# Patient Record
Sex: Male | Born: 1940 | Race: White | Hispanic: No | Marital: Married
Health system: Midwestern US, Academic
[De-identification: ages and names within clinical notes are randomized; demographics above are authoritative.]

---

## 2017-10-22 ENCOUNTER — Encounter: Admit: 2017-10-22 | Discharge: 2017-10-22 | Payer: MEDICARE

## 2017-10-22 DIAGNOSIS — M48061 Spinal stenosis, lumbar region without neurogenic claudication: ICD-10-CM

## 2017-10-22 DIAGNOSIS — K219 Gastro-esophageal reflux disease without esophagitis: ICD-10-CM

## 2017-10-22 DIAGNOSIS — M4307 Spondylolysis, lumbosacral region: ICD-10-CM

## 2017-10-22 DIAGNOSIS — J449 Chronic obstructive pulmonary disease, unspecified: ICD-10-CM

## 2017-10-22 DIAGNOSIS — M1990 Unspecified osteoarthritis, unspecified site: ICD-10-CM

## 2017-10-22 DIAGNOSIS — E669 Obesity, unspecified: ICD-10-CM

## 2017-10-22 DIAGNOSIS — E119 Type 2 diabetes mellitus without complications: ICD-10-CM

## 2017-10-22 DIAGNOSIS — M5106 Intervertebral disc disorders with myelopathy, lumbar region: ICD-10-CM

## 2017-10-22 DIAGNOSIS — I4891 Unspecified atrial fibrillation: Principal | ICD-10-CM

## 2017-10-22 DIAGNOSIS — E785 Hyperlipidemia, unspecified: ICD-10-CM

## 2017-10-22 DIAGNOSIS — Z8551 Personal history of malignant neoplasm of bladder: ICD-10-CM

## 2017-10-22 DIAGNOSIS — I10 Essential (primary) hypertension: ICD-10-CM

## 2017-10-22 DIAGNOSIS — M545 Low back pain: ICD-10-CM

## 2017-10-22 DIAGNOSIS — R972 Elevated prostate specific antigen [PSA]: ICD-10-CM

## 2017-10-22 DIAGNOSIS — C44310 Basal cell carcinoma of skin of unspecified parts of face: ICD-10-CM

## 2017-10-22 DIAGNOSIS — I2510 Atherosclerotic heart disease of native coronary artery without angina pectoris: ICD-10-CM

## 2017-10-22 DIAGNOSIS — R0902 Hypoxemia: ICD-10-CM

## 2017-10-22 DIAGNOSIS — G4733 Obstructive sleep apnea (adult) (pediatric): ICD-10-CM

## 2017-10-22 DIAGNOSIS — I272 Pulmonary hypertension, unspecified: ICD-10-CM

## 2017-10-23 ENCOUNTER — Encounter: Admit: 2017-10-23 | Discharge: 2017-10-23 | Payer: MEDICARE

## 2017-10-23 ENCOUNTER — Ambulatory Visit: Admit: 2017-10-23 | Discharge: 2017-10-23 | Payer: MEDICARE

## 2017-10-23 DIAGNOSIS — I10 Essential (primary) hypertension: ICD-10-CM

## 2017-10-23 DIAGNOSIS — M1990 Unspecified osteoarthritis, unspecified site: ICD-10-CM

## 2017-10-23 DIAGNOSIS — K219 Gastro-esophageal reflux disease without esophagitis: ICD-10-CM

## 2017-10-23 DIAGNOSIS — M5106 Intervertebral disc disorders with myelopathy, lumbar region: ICD-10-CM

## 2017-10-23 DIAGNOSIS — G4733 Obstructive sleep apnea (adult) (pediatric): ICD-10-CM

## 2017-10-23 DIAGNOSIS — C44310 Basal cell carcinoma of skin of unspecified parts of face: ICD-10-CM

## 2017-10-23 DIAGNOSIS — Z9981 Dependence on supplemental oxygen: ICD-10-CM

## 2017-10-23 DIAGNOSIS — E669 Obesity, unspecified: ICD-10-CM

## 2017-10-23 DIAGNOSIS — M4307 Spondylolysis, lumbosacral region: ICD-10-CM

## 2017-10-23 DIAGNOSIS — M545 Low back pain: ICD-10-CM

## 2017-10-23 DIAGNOSIS — I2510 Atherosclerotic heart disease of native coronary artery without angina pectoris: Principal | ICD-10-CM

## 2017-10-23 DIAGNOSIS — E119 Type 2 diabetes mellitus without complications: ICD-10-CM

## 2017-10-23 DIAGNOSIS — I200 Unstable angina: ICD-10-CM

## 2017-10-23 DIAGNOSIS — I4891 Unspecified atrial fibrillation: Principal | ICD-10-CM

## 2017-10-23 DIAGNOSIS — R0600 Dyspnea, unspecified: ICD-10-CM

## 2017-10-23 DIAGNOSIS — Z7901 Long term (current) use of anticoagulants: ICD-10-CM

## 2017-10-23 DIAGNOSIS — R0902 Hypoxemia: ICD-10-CM

## 2017-10-23 DIAGNOSIS — I2583 Coronary atherosclerosis due to lipid rich plaque: ICD-10-CM

## 2017-10-23 DIAGNOSIS — Z8551 Personal history of malignant neoplasm of bladder: ICD-10-CM

## 2017-10-23 DIAGNOSIS — J449 Chronic obstructive pulmonary disease, unspecified: Secondary | ICD-10-CM

## 2017-10-23 DIAGNOSIS — E876 Hypokalemia: Principal | ICD-10-CM

## 2017-10-23 DIAGNOSIS — E785 Hyperlipidemia, unspecified: ICD-10-CM

## 2017-10-23 DIAGNOSIS — M48061 Spinal stenosis, lumbar region without neurogenic claudication: ICD-10-CM

## 2017-10-23 DIAGNOSIS — I272 Pulmonary hypertension, unspecified: ICD-10-CM

## 2017-10-23 DIAGNOSIS — I25110 Atherosclerotic heart disease of native coronary artery with unstable angina pectoris: ICD-10-CM

## 2017-10-23 DIAGNOSIS — E782 Mixed hyperlipidemia: ICD-10-CM

## 2017-10-23 DIAGNOSIS — R972 Elevated prostate specific antigen [PSA]: ICD-10-CM

## 2017-10-23 LAB — CBC
Lab: 15 % — ABNORMAL HIGH (ref >60)
Lab: 15 g/dL (ref 13.5–16.5)
Lab: 225 10*3/uL (ref >60)
Lab: 31 pg (ref 26–34)
Lab: 33 g/dL (ref 32.0–36.0)
Lab: 47 % — ABNORMAL HIGH (ref 40–50)
Lab: 5 M/UL — ABNORMAL HIGH (ref 4.4–5.5)
Lab: 8.4 FL (ref 7–11)
Lab: 9.5 10*3/uL — ABNORMAL LOW (ref 4.5–11.0)
Lab: 93 FL (ref 80–100)

## 2017-10-23 LAB — BASIC METABOLIC PANEL
Lab: 135 MMOL/L — ABNORMAL LOW (ref 137–147)
Lab: 2.7 MMOL/L — CL (ref 3.5–5.1)

## 2017-10-23 MED ORDER — ASPIRIN 81 MG PO CHEW
324 mg | Freq: Once | ORAL | 0 refills | Status: CN
Start: 2017-10-23 — End: ?

## 2017-10-23 MED ORDER — IMS MIXTURE TEMPLATE
60 mg | Freq: Once | ORAL | 0 refills | Status: CN
Start: 2017-10-23 — End: ?

## 2017-10-23 MED ORDER — CLOPIDOGREL 75 MG PO TAB
75 mg | ORAL_TABLET | Freq: Every day | ORAL | 3 refills | 90.00000 days | Status: AC
Start: 2017-10-23 — End: 2018-09-24

## 2017-10-23 MED ORDER — MAGNESIUM OXIDE 400 MG (241.3 MG MAGNESIUM) PO TAB
2000 mg | ORAL_TABLET | Freq: Once | ORAL | 0 refills | Status: AC
Start: 2017-10-23 — End: ?

## 2017-10-23 MED ORDER — PREDNISONE 20 MG PO TAB
ORAL_TABLET | 0 refills | Status: SS
Start: 2017-10-23 — End: 2017-11-07

## 2017-10-24 DIAGNOSIS — Z7901 Long term (current) use of anticoagulants: ICD-10-CM

## 2017-10-24 DIAGNOSIS — I2510 Atherosclerotic heart disease of native coronary artery without angina pectoris: ICD-10-CM

## 2017-10-25 ENCOUNTER — Encounter: Admit: 2017-10-25 | Discharge: 2017-10-25 | Payer: MEDICARE

## 2017-10-31 ENCOUNTER — Encounter: Admit: 2017-10-31 | Discharge: 2017-10-31 | Payer: MEDICARE

## 2017-10-31 DIAGNOSIS — I2510 Atherosclerotic heart disease of native coronary artery without angina pectoris: Principal | ICD-10-CM

## 2017-10-31 MED ORDER — FAMOTIDINE 20 MG PO TAB
20 mg | Freq: Once | ORAL | 0 refills | Status: CN
Start: 2017-10-31 — End: ?

## 2017-10-31 MED ORDER — MAGNESIUM HYDROXIDE 2,400 MG/10 ML PO SUSP
10 mL | ORAL | 0 refills | Status: CN | PRN
Start: 2017-10-31 — End: ?

## 2017-10-31 MED ORDER — DIPHENHYDRAMINE HCL 50 MG PO CAP
50 mg | Freq: Once | ORAL | 0 refills | Status: CN
Start: 2017-10-31 — End: ?

## 2017-10-31 MED ORDER — ACETAMINOPHEN 325 MG PO TAB
650 mg | ORAL | 0 refills | Status: CN | PRN
Start: 2017-10-31 — End: ?

## 2017-10-31 MED ORDER — TEMAZEPAM 15 MG PO CAP
15 mg | Freq: Every evening | ORAL | 0 refills | Status: CN | PRN
Start: 2017-10-31 — End: ?

## 2017-10-31 MED ORDER — ALUMINUM-MAGNESIUM HYDROXIDE 200-200 MG/5 ML PO SUSP
30 mL | ORAL | 0 refills | Status: CN | PRN
Start: 2017-10-31 — End: ?

## 2017-11-02 ENCOUNTER — Encounter: Admit: 2017-11-02 | Discharge: 2017-11-02 | Payer: MEDICARE

## 2017-11-02 DIAGNOSIS — E876 Hypokalemia: Principal | ICD-10-CM

## 2017-11-02 LAB — BASIC METABOLIC PANEL
Lab: 1
Lab: 134 — AB (ref 135–145)
Lab: 21
Lab: 3.9
Lab: 249
Lab: 31
Lab: 8.6
Lab: 97

## 2017-11-07 ENCOUNTER — Encounter: Admit: 2017-11-07 | Discharge: 2017-11-07 | Payer: MEDICARE

## 2017-11-07 ENCOUNTER — Ambulatory Visit: Admit: 2017-11-07 | Discharge: 2017-11-07 | Payer: MEDICARE

## 2017-11-07 ENCOUNTER — Encounter: Admit: 2017-11-07 | Discharge: 2017-11-08 | Payer: MEDICARE

## 2017-11-07 DIAGNOSIS — M545 Low back pain: ICD-10-CM

## 2017-11-07 DIAGNOSIS — E119 Type 2 diabetes mellitus without complications: ICD-10-CM

## 2017-11-07 DIAGNOSIS — R972 Elevated prostate specific antigen [PSA]: ICD-10-CM

## 2017-11-07 DIAGNOSIS — K219 Gastro-esophageal reflux disease without esophagitis: ICD-10-CM

## 2017-11-07 DIAGNOSIS — M1990 Unspecified osteoarthritis, unspecified site: ICD-10-CM

## 2017-11-07 DIAGNOSIS — J449 Chronic obstructive pulmonary disease, unspecified: Secondary | ICD-10-CM

## 2017-11-07 DIAGNOSIS — R0902 Hypoxemia: ICD-10-CM

## 2017-11-07 DIAGNOSIS — I272 Pulmonary hypertension, unspecified: ICD-10-CM

## 2017-11-07 DIAGNOSIS — I4891 Unspecified atrial fibrillation: Principal | ICD-10-CM

## 2017-11-07 DIAGNOSIS — G4733 Obstructive sleep apnea (adult) (pediatric): ICD-10-CM

## 2017-11-07 DIAGNOSIS — I2510 Atherosclerotic heart disease of native coronary artery without angina pectoris: Secondary | ICD-10-CM

## 2017-11-07 DIAGNOSIS — I200 Unstable angina: ICD-10-CM

## 2017-11-07 DIAGNOSIS — M5106 Intervertebral disc disorders with myelopathy, lumbar region: ICD-10-CM

## 2017-11-07 DIAGNOSIS — Z9981 Dependence on supplemental oxygen: ICD-10-CM

## 2017-11-07 DIAGNOSIS — Z8551 Personal history of malignant neoplasm of bladder: ICD-10-CM

## 2017-11-07 DIAGNOSIS — Z7901 Long term (current) use of anticoagulants: ICD-10-CM

## 2017-11-07 DIAGNOSIS — M4307 Spondylolysis, lumbosacral region: ICD-10-CM

## 2017-11-07 DIAGNOSIS — C44310 Basal cell carcinoma of skin of unspecified parts of face: ICD-10-CM

## 2017-11-07 DIAGNOSIS — I10 Essential (primary) hypertension: ICD-10-CM

## 2017-11-07 DIAGNOSIS — E669 Obesity, unspecified: ICD-10-CM

## 2017-11-07 DIAGNOSIS — M48061 Spinal stenosis, lumbar region without neurogenic claudication: ICD-10-CM

## 2017-11-07 DIAGNOSIS — E785 Hyperlipidemia, unspecified: ICD-10-CM

## 2017-11-07 LAB — LIPID PROFILE
Lab: 100 mg/dL
Lab: 13 mg/dL
Lab: 157 mg/dL (ref <200)
Lab: 57 mg/dL (ref >40)
Lab: 66 mg/dL (ref <150)
Lab: 88 mg/dL (ref <100)

## 2017-11-07 LAB — BASIC METABOLIC PANEL
Lab: 0.9 mg/dL (ref 0.4–1.24)
Lab: 136 MMOL/L — ABNORMAL LOW (ref >=60)
Lab: 20 mg/dL — ABNORMAL LOW (ref 7–25)
Lab: 227 mg/dL — ABNORMAL HIGH (ref 70–100)
Lab: 3.3 MMOL/L — ABNORMAL LOW (ref >=60)
Lab: 32 MMOL/L — ABNORMAL HIGH (ref 21–30)

## 2017-11-07 LAB — POC ACTIVATED CLOTTING TIME
Lab: 305 s
Lab: 286 s (ref 21–30)
Lab: 197 s
Lab: 207 s
Lab: 176 s

## 2017-11-07 LAB — CBC
Lab: 15 % — ABNORMAL HIGH (ref 11–15)
Lab: 15 g/dL (ref 13.5–16.5)
Lab: 211 10*3/uL (ref 150–400)
Lab: 31 pg (ref 26–34)
Lab: 33 g/dL (ref 32.0–36.0)
Lab: 46 % (ref 40–50)
Lab: 8 FL — ABNORMAL HIGH (ref 7–11)
Lab: 8.6 10*3/uL (ref 4.5–11.0)
Lab: 93 FL (ref 80–100)

## 2017-11-07 LAB — POC GLUCOSE
Lab: 219 mg/dL — ABNORMAL HIGH (ref 70–100)
Lab: 204 mg/dL — ABNORMAL HIGH (ref 70–100)

## 2017-11-07 LAB — TROPONIN-I

## 2017-11-07 MED ORDER — INSULIN ASPART 100 UNIT/ML SC FLEXPEN
0-6 [IU] | Freq: Before meals | SUBCUTANEOUS | 0 refills | Status: DC
Start: 2017-11-07 — End: 2017-11-08
  Administered 2017-11-07: 23:00:00 1 [IU] via SUBCUTANEOUS

## 2017-11-07 MED ORDER — PREDNISONE 20 MG PO TAB
60 mg | Freq: Once | ORAL | 0 refills | Status: AC
Start: 2017-11-07 — End: ?

## 2017-11-07 MED ORDER — DIPHENHYDRAMINE HCL 50 MG PO CAP
50 mg | Freq: Once | ORAL | 0 refills | Status: CP
Start: 2017-11-07 — End: ?
  Administered 2017-11-07: 14:00:00 50 mg via ORAL

## 2017-11-07 MED ORDER — DIPHENHYDRAMINE HCL 25 MG PO CAP
25 mg | ORAL | 0 refills | Status: DC | PRN
Start: 2017-11-07 — End: 2017-11-08

## 2017-11-07 MED ORDER — BUMETANIDE 2 MG PO TAB
2 mg | Freq: Every day | ORAL | 0 refills | Status: DC
Start: 2017-11-07 — End: 2017-11-08

## 2017-11-07 MED ORDER — ASPIRIN 81 MG PO CHEW
324 mg | Freq: Once | ORAL | 0 refills | Status: DC
Start: 2017-11-07 — End: 2017-11-07

## 2017-11-07 MED ORDER — MONTELUKAST 10 MG PO TAB
10 mg | Freq: Every evening | ORAL | 0 refills | Status: DC
Start: 2017-11-07 — End: 2017-11-08
  Administered 2017-11-08: 03:00:00 10 mg via ORAL

## 2017-11-07 MED ORDER — METOPROLOL TARTRATE 25 MG PO TAB
25 mg | Freq: Two times a day (BID) | ORAL | 0 refills | Status: DC
Start: 2017-11-07 — End: 2017-11-08
  Administered 2017-11-08 (×2): 25 mg via ORAL

## 2017-11-07 MED ORDER — POTASSIUM CHLORIDE 20 MEQ PO TBTQ
40-60 meq | ORAL | 0 refills | Status: DC | PRN
Start: 2017-11-07 — End: 2017-11-08
  Administered 2017-11-07 – 2017-11-08 (×2): 40 meq via ORAL

## 2017-11-07 MED ORDER — ATORVASTATIN 20 MG PO TAB
20 mg | Freq: Every evening | ORAL | 0 refills | Status: DC
Start: 2017-11-07 — End: 2017-11-07

## 2017-11-07 MED ORDER — CLOPIDOGREL 75 MG PO TAB
75 mg | Freq: Every day | ORAL | 0 refills | Status: DC
Start: 2017-11-07 — End: 2017-11-07

## 2017-11-07 MED ORDER — HYDROCODONE-ACETAMINOPHEN 5-325 MG PO TAB
1 | ORAL | 0 refills | Status: DC | PRN
Start: 2017-11-07 — End: 2017-11-08

## 2017-11-07 MED ORDER — PIOGLITAZONE 15 MG PO TAB
15 mg | Freq: Every day | ORAL | 0 refills | Status: DC
Start: 2017-11-07 — End: 2017-11-08
  Administered 2017-11-07: 23:00:00 15 mg via ORAL

## 2017-11-07 MED ORDER — CLOPIDOGREL 75 MG PO TAB
75 mg | Freq: Once | ORAL | 0 refills | Status: CP
Start: 2017-11-07 — End: ?
  Administered 2017-11-07: 14:00:00 75 mg via ORAL

## 2017-11-07 MED ORDER — FAMOTIDINE 20 MG PO TAB
20 mg | Freq: Once | ORAL | 0 refills | Status: CP
Start: 2017-11-07 — End: ?
  Administered 2017-11-07: 14:00:00 20 mg via ORAL

## 2017-11-07 MED ORDER — FAMOTIDINE 20 MG PO TAB
20 mg | Freq: Two times a day (BID) | ORAL | 0 refills | Status: DC
Start: 2017-11-07 — End: 2017-11-08
  Administered 2017-11-08: 03:00:00 20 mg via ORAL

## 2017-11-07 MED ORDER — MAGNESIUM HYDROXIDE 2,400 MG/10 ML PO SUSP
10 mL | ORAL | 0 refills | Status: DC | PRN
Start: 2017-11-07 — End: 2017-11-08

## 2017-11-07 MED ORDER — ALPRAZOLAM 0.25 MG PO TAB
.25 mg | Freq: Every day | ORAL | 0 refills | Status: DC | PRN
Start: 2017-11-07 — End: 2017-11-08

## 2017-11-07 MED ORDER — POTASSIUM CHLORIDE 20 MEQ PO TBTQ
20 meq | Freq: Two times a day (BID) | ORAL | 0 refills | Status: DC
Start: 2017-11-07 — End: 2017-11-08

## 2017-11-07 MED ORDER — CLOPIDOGREL 75 MG PO TAB
75 mg | Freq: Every day | ORAL | 0 refills | Status: DC
Start: 2017-11-07 — End: 2017-11-08

## 2017-11-07 MED ORDER — ONDANSETRON HCL (PF) 4 MG/2 ML IJ SOLN
4 mg | INTRAVENOUS | 0 refills | Status: DC | PRN
Start: 2017-11-07 — End: 2017-11-08

## 2017-11-07 MED ORDER — INSULIN NPH AND REGULAR HUMAN 100 UNIT/ML (70-30) SC SUSP
60 [IU] | Freq: Once | SUBCUTANEOUS | 0 refills | Status: CP
Start: 2017-11-07 — End: ?
  Administered 2017-11-08: 03:00:00 60 [IU] via SUBCUTANEOUS

## 2017-11-07 MED ORDER — ALBUTEROL SULFATE 90 MCG/ACTUATION IN HFAA
2 | RESPIRATORY_TRACT | 0 refills | Status: DC | PRN
Start: 2017-11-07 — End: 2017-11-08

## 2017-11-07 MED ORDER — ATORVASTATIN 40 MG PO TAB
40 mg | Freq: Every evening | ORAL | 0 refills | Status: DC
Start: 2017-11-07 — End: 2017-11-08
  Administered 2017-11-08: 03:00:00 40 mg via ORAL

## 2017-11-07 MED ORDER — INSULIN NPH AND REGULAR HUMAN 100 UNIT/ML (70-30) SC SUSP
80 [IU] | Freq: Every morning | SUBCUTANEOUS | 0 refills | Status: DC
Start: 2017-11-07 — End: 2017-11-08
  Administered 2017-11-08: 13:00:00 80 [IU] via SUBCUTANEOUS

## 2017-11-07 MED ORDER — APIXABAN 5 MG PO TAB
5 mg | Freq: Two times a day (BID) | ORAL | 0 refills | Status: DC
Start: 2017-11-07 — End: 2017-11-08
  Administered 2017-11-08: 13:00:00 5 mg via ORAL

## 2017-11-07 MED ORDER — ATORVASTATIN 40 MG PO TAB
20 mg | ORAL_TABLET | Freq: Every evening | ORAL | 3 refills | Status: CN
Start: 2017-11-07 — End: ?

## 2017-11-07 MED ORDER — ALUMINUM-MAGNESIUM HYDROXIDE 200-200 MG/5 ML PO SUSP
30 mL | ORAL | 0 refills | Status: DC | PRN
Start: 2017-11-07 — End: 2017-11-08

## 2017-11-07 MED ORDER — DIPHENHYDRAMINE HCL 50 MG/ML IJ SOLN
25 mg | INTRAVENOUS | 0 refills | Status: DC | PRN
Start: 2017-11-07 — End: 2017-11-08

## 2017-11-07 MED ORDER — DILTIAZEM HCL 240 MG PO CP24
240 mg | Freq: Every day | ORAL | 0 refills | Status: DC
Start: 2017-11-07 — End: 2017-11-08
  Administered 2017-11-07 – 2017-11-08 (×2): 240 mg via ORAL

## 2017-11-07 MED ORDER — TEMAZEPAM 15 MG PO CAP
15 mg | Freq: Every evening | ORAL | 0 refills | Status: DC | PRN
Start: 2017-11-07 — End: 2017-11-08

## 2017-11-07 MED ORDER — POTASSIUM CHLORIDE 20 MEQ/15 ML PO LIQD
40-60 meq | NASOGASTRIC | 0 refills | Status: DC | PRN
Start: 2017-11-07 — End: 2017-11-08

## 2017-11-07 MED ORDER — PANTOPRAZOLE 40 MG PO TBEC
40 mg | Freq: Two times a day (BID) | ORAL | 0 refills | Status: DC
Start: 2017-11-07 — End: 2017-11-08

## 2017-11-07 MED ORDER — TIOTROPIUM BROMIDE 18 MCG IN CPDV
1 | Freq: Every day | RESPIRATORY_TRACT | 0 refills | Status: DC
Start: 2017-11-07 — End: 2017-11-08
  Administered 2017-11-08: 11:00:00 1 via RESPIRATORY_TRACT

## 2017-11-07 MED ORDER — ACETAMINOPHEN 325 MG PO TAB
650 mg | ORAL | 0 refills | Status: DC | PRN
Start: 2017-11-07 — End: 2017-11-08

## 2017-11-07 MED ORDER — METFORMIN 500 MG PO TAB
500 mg | ORAL_TABLET | Freq: Two times a day (BID) | ORAL | 3 refills | Status: CN
Start: 2017-11-07 — End: ?

## 2017-11-07 MED ORDER — CLOPIDOGREL 75 MG PO TAB
75 mg | ORAL_TABLET | Freq: Every day | ORAL | 3 refills | Status: CN
Start: 2017-11-07 — End: ?

## 2017-11-08 DIAGNOSIS — Z794 Long term (current) use of insulin: ICD-10-CM

## 2017-11-08 DIAGNOSIS — Z006 Encounter for examination for normal comparison and control in clinical research program: ICD-10-CM

## 2017-11-08 DIAGNOSIS — I25110 Atherosclerotic heart disease of native coronary artery with unstable angina pectoris: Principal | ICD-10-CM

## 2017-11-08 DIAGNOSIS — R0609 Other forms of dyspnea: ICD-10-CM

## 2017-11-08 DIAGNOSIS — E118 Type 2 diabetes mellitus with unspecified complications: ICD-10-CM

## 2017-11-08 DIAGNOSIS — I2582 Chronic total occlusion of coronary artery: ICD-10-CM

## 2017-11-08 LAB — TROPONIN-I

## 2017-11-08 LAB — POC ACTIVATED CLOTTING TIME
Lab: 279 s (ref 11–15)
Lab: 283 s
Lab: 286 s
Lab: 363 s
Lab: 253 s — ABNORMAL LOW (ref 7–40)

## 2017-11-08 LAB — POC GLUCOSE
Lab: 220 mg/dL — ABNORMAL HIGH (ref 70–100)
Lab: 153 mg/dL — ABNORMAL HIGH (ref 70–100)

## 2017-11-08 LAB — BASIC METABOLIC PANEL: Lab: 135 MMOL/L — ABNORMAL LOW (ref 137–147)

## 2017-11-08 LAB — CBC
Lab: 4.8 M/UL — ABNORMAL LOW (ref 4.4–5.5)
Lab: 9.9 K/UL — ABNORMAL HIGH (ref 4.5–11.0)

## 2017-11-08 MED ORDER — METFORMIN 500 MG PO TAB
500 mg | ORAL_TABLET | Freq: Two times a day (BID) | ORAL | 3 refills | Status: AC
Start: 2017-11-08 — End: ?

## 2017-11-08 MED ORDER — ATORVASTATIN 40 MG PO TAB
40 mg | ORAL_TABLET | Freq: Every evening | ORAL | 3 refills | Status: AC
Start: 2017-11-08 — End: 2018-01-11

## 2017-11-27 ENCOUNTER — Encounter: Admit: 2017-11-27 | Discharge: 2017-11-27 | Payer: MEDICARE

## 2017-11-29 ENCOUNTER — Encounter: Admit: 2017-11-29 | Discharge: 2017-11-29 | Payer: MEDICARE

## 2017-11-29 DIAGNOSIS — E785 Hyperlipidemia, unspecified: ICD-10-CM

## 2017-11-29 DIAGNOSIS — M48061 Spinal stenosis, lumbar region without neurogenic claudication: ICD-10-CM

## 2017-11-29 DIAGNOSIS — K219 Gastro-esophageal reflux disease without esophagitis: ICD-10-CM

## 2017-11-29 DIAGNOSIS — R0902 Hypoxemia: ICD-10-CM

## 2017-11-29 DIAGNOSIS — M545 Low back pain: ICD-10-CM

## 2017-11-29 DIAGNOSIS — M1990 Unspecified osteoarthritis, unspecified site: ICD-10-CM

## 2017-11-29 DIAGNOSIS — Z7901 Long term (current) use of anticoagulants: ICD-10-CM

## 2017-11-29 DIAGNOSIS — I2510 Atherosclerotic heart disease of native coronary artery without angina pectoris: Secondary | ICD-10-CM

## 2017-11-29 DIAGNOSIS — R972 Elevated prostate specific antigen [PSA]: ICD-10-CM

## 2017-11-29 DIAGNOSIS — C44310 Basal cell carcinoma of skin of unspecified parts of face: ICD-10-CM

## 2017-11-29 DIAGNOSIS — E119 Type 2 diabetes mellitus without complications: ICD-10-CM

## 2017-11-29 DIAGNOSIS — M4307 Spondylolysis, lumbosacral region: ICD-10-CM

## 2017-11-29 DIAGNOSIS — I272 Pulmonary hypertension, unspecified: ICD-10-CM

## 2017-11-29 DIAGNOSIS — E669 Obesity, unspecified: ICD-10-CM

## 2017-11-29 DIAGNOSIS — J449 Chronic obstructive pulmonary disease, unspecified: Secondary | ICD-10-CM

## 2017-11-29 DIAGNOSIS — I200 Unstable angina: ICD-10-CM

## 2017-11-29 DIAGNOSIS — G4733 Obstructive sleep apnea (adult) (pediatric): ICD-10-CM

## 2017-11-29 DIAGNOSIS — M5106 Intervertebral disc disorders with myelopathy, lumbar region: ICD-10-CM

## 2017-11-29 DIAGNOSIS — I10 Essential (primary) hypertension: ICD-10-CM

## 2017-11-29 DIAGNOSIS — Z9981 Dependence on supplemental oxygen: ICD-10-CM

## 2017-11-29 DIAGNOSIS — Z8551 Personal history of malignant neoplasm of bladder: ICD-10-CM

## 2017-11-29 DIAGNOSIS — I4891 Unspecified atrial fibrillation: Principal | ICD-10-CM

## 2017-11-29 MED ORDER — ISOSORBIDE MONONITRATE 30 MG PO TB24
30 mg | ORAL_TABLET | Freq: Every morning | ORAL | 3 refills | 30.00000 days | Status: AC
Start: 2017-11-29 — End: 2018-03-12

## 2017-11-30 ENCOUNTER — Ambulatory Visit: Admit: 2017-11-29 | Discharge: 2017-11-30 | Payer: MEDICARE

## 2017-11-30 ENCOUNTER — Encounter: Admit: 2017-11-30 | Discharge: 2017-11-30 | Payer: MEDICARE

## 2017-11-30 DIAGNOSIS — I2510 Atherosclerotic heart disease of native coronary artery without angina pectoris: Principal | ICD-10-CM

## 2017-11-30 DIAGNOSIS — I482 Chronic atrial fibrillation: ICD-10-CM

## 2017-11-30 DIAGNOSIS — I2583 Coronary atherosclerosis due to lipid rich plaque: ICD-10-CM

## 2017-11-30 DIAGNOSIS — I10 Essential (primary) hypertension: ICD-10-CM

## 2017-11-30 DIAGNOSIS — E782 Mixed hyperlipidemia: ICD-10-CM

## 2017-12-04 ENCOUNTER — Encounter: Admit: 2017-12-04 | Discharge: 2017-12-04 | Payer: MEDICARE

## 2017-12-05 ENCOUNTER — Encounter: Admit: 2017-12-05 | Discharge: 2017-12-05 | Payer: MEDICARE

## 2017-12-20 LAB — LIPID PROFILE
Lab: 128
Lab: 138 — ABNORMAL HIGH (ref 21–30)
Lab: 2.2 — ABNORMAL HIGH (ref 0.2–1.2)
Lab: 49 — ABNORMAL LOW (ref 60–99)
Lab: 63 — ABNORMAL HIGH (ref 40–60)
Lab: 75 — ABNORMAL LOW (ref 90–129)

## 2017-12-20 LAB — COMPREHENSIVE METABOLIC PANEL
Lab: 136
Lab: 18
Lab: 31
Lab: 57

## 2017-12-20 LAB — THYROID STIMULATING HORMONE-TSH: Lab: 1.8 — ABNORMAL LOW (ref 97–107)

## 2017-12-20 LAB — HEMOGLOBIN A1C: Lab: 7.4 — ABNORMAL HIGH (ref <5.6)

## 2018-01-11 ENCOUNTER — Ambulatory Visit: Admit: 2018-01-11 | Discharge: 2018-01-12 | Payer: MEDICARE

## 2018-01-11 ENCOUNTER — Encounter: Admit: 2018-01-11 | Discharge: 2018-01-11 | Payer: MEDICARE

## 2018-01-11 DIAGNOSIS — I10 Essential (primary) hypertension: ICD-10-CM

## 2018-01-11 DIAGNOSIS — M5106 Intervertebral disc disorders with myelopathy, lumbar region: ICD-10-CM

## 2018-01-11 DIAGNOSIS — I2510 Atherosclerotic heart disease of native coronary artery without angina pectoris: Secondary | ICD-10-CM

## 2018-01-11 DIAGNOSIS — R0902 Hypoxemia: ICD-10-CM

## 2018-01-11 DIAGNOSIS — Z8551 Personal history of malignant neoplasm of bladder: ICD-10-CM

## 2018-01-11 DIAGNOSIS — M545 Low back pain: ICD-10-CM

## 2018-01-11 DIAGNOSIS — I272 Pulmonary hypertension, unspecified: ICD-10-CM

## 2018-01-11 DIAGNOSIS — E1159 Type 2 diabetes mellitus with other circulatory complications: ICD-10-CM

## 2018-01-11 DIAGNOSIS — I482 Chronic atrial fibrillation, unspecified: ICD-10-CM

## 2018-01-11 DIAGNOSIS — K219 Gastro-esophageal reflux disease without esophagitis: ICD-10-CM

## 2018-01-11 DIAGNOSIS — E119 Type 2 diabetes mellitus without complications: ICD-10-CM

## 2018-01-11 DIAGNOSIS — Z9981 Dependence on supplemental oxygen: ICD-10-CM

## 2018-01-11 DIAGNOSIS — E785 Hyperlipidemia, unspecified: ICD-10-CM

## 2018-01-11 DIAGNOSIS — E669 Obesity, unspecified: ICD-10-CM

## 2018-01-11 DIAGNOSIS — I4891 Unspecified atrial fibrillation: Principal | ICD-10-CM

## 2018-01-11 DIAGNOSIS — I519 Heart disease, unspecified: ICD-10-CM

## 2018-01-11 DIAGNOSIS — E782 Mixed hyperlipidemia: ICD-10-CM

## 2018-01-11 DIAGNOSIS — R972 Elevated prostate specific antigen [PSA]: ICD-10-CM

## 2018-01-11 DIAGNOSIS — G4733 Obstructive sleep apnea (adult) (pediatric): ICD-10-CM

## 2018-01-11 DIAGNOSIS — Z7901 Long term (current) use of anticoagulants: ICD-10-CM

## 2018-01-11 DIAGNOSIS — J449 Chronic obstructive pulmonary disease, unspecified: Secondary | ICD-10-CM

## 2018-01-11 DIAGNOSIS — I208 Other forms of angina pectoris: ICD-10-CM

## 2018-01-11 DIAGNOSIS — I25118 Atherosclerotic heart disease of native coronary artery with other forms of angina pectoris: Principal | ICD-10-CM

## 2018-01-11 DIAGNOSIS — M1990 Unspecified osteoarthritis, unspecified site: ICD-10-CM

## 2018-01-11 DIAGNOSIS — M48061 Spinal stenosis, lumbar region without neurogenic claudication: ICD-10-CM

## 2018-01-11 DIAGNOSIS — C44310 Basal cell carcinoma of skin of unspecified parts of face: ICD-10-CM

## 2018-01-11 DIAGNOSIS — I200 Unstable angina: ICD-10-CM

## 2018-01-11 DIAGNOSIS — M4307 Spondylolysis, lumbosacral region: ICD-10-CM

## 2018-01-11 MED ORDER — METOPROLOL SUCCINATE 50 MG PO TB24
50 mg | ORAL_TABLET | Freq: Every day | ORAL | 3 refills | 90.00000 days | Status: AC
Start: 2018-01-11 — End: 2018-02-05

## 2018-01-11 MED ORDER — ATORVASTATIN 80 MG PO TAB
80 mg | ORAL_TABLET | Freq: Every evening | ORAL | 3 refills | Status: AC
Start: 2018-01-11 — End: 2018-01-11

## 2018-01-11 MED ORDER — SPIRONOLACTONE 25 MG PO TAB
25 mg | ORAL_TABLET | Freq: Every day | ORAL | 3 refills | 90.00000 days | Status: AC
Start: 2018-01-11 — End: 2019-01-01

## 2018-01-11 MED ORDER — ATORVASTATIN 40 MG PO TAB
40 mg | ORAL_TABLET | Freq: Every evening | ORAL | 3 refills | Status: AC
Start: 2018-01-11 — End: 2018-01-30

## 2018-01-11 MED ORDER — BUMETANIDE 2 MG PO TAB
2 mg | Freq: Two times a day (BID) | ORAL | 0 refills | Status: AC
Start: 2018-01-11 — End: ?

## 2018-01-11 MED ORDER — METOLAZONE 2.5 MG PO TAB
2.5 mg | ORAL | 0 refills | 84.00000 days | Status: AC | PRN
Start: 2018-01-11 — End: 2018-01-30

## 2018-01-14 ENCOUNTER — Encounter: Admit: 2018-01-14 | Discharge: 2018-01-14 | Payer: MEDICARE

## 2018-01-21 LAB — BASIC METABOLIC PANEL
Lab: 140
Lab: 2.6 — AB (ref 1.60–2.60)
Lab: 4.2
Lab: 76
Lab: 88
Lab: 9.5

## 2018-01-22 ENCOUNTER — Encounter: Admit: 2018-01-22 | Discharge: 2018-01-22 | Payer: MEDICARE

## 2018-01-30 ENCOUNTER — Encounter: Admit: 2018-01-30 | Discharge: 2018-01-30 | Payer: MEDICARE

## 2018-01-30 ENCOUNTER — Ambulatory Visit: Admit: 2018-01-30 | Discharge: 2018-01-31 | Payer: MEDICARE

## 2018-01-30 DIAGNOSIS — I4891 Unspecified atrial fibrillation: Principal | ICD-10-CM

## 2018-01-30 DIAGNOSIS — M4307 Spondylolysis, lumbosacral region: ICD-10-CM

## 2018-01-30 DIAGNOSIS — J449 Chronic obstructive pulmonary disease, unspecified: Secondary | ICD-10-CM

## 2018-01-30 DIAGNOSIS — Z9981 Dependence on supplemental oxygen: ICD-10-CM

## 2018-01-30 DIAGNOSIS — I519 Heart disease, unspecified: ICD-10-CM

## 2018-01-30 DIAGNOSIS — C44310 Basal cell carcinoma of skin of unspecified parts of face: ICD-10-CM

## 2018-01-30 DIAGNOSIS — I200 Unstable angina: ICD-10-CM

## 2018-01-30 DIAGNOSIS — I208 Other forms of angina pectoris: ICD-10-CM

## 2018-01-30 DIAGNOSIS — I10 Essential (primary) hypertension: ICD-10-CM

## 2018-01-30 DIAGNOSIS — M545 Low back pain: ICD-10-CM

## 2018-01-30 DIAGNOSIS — M48061 Spinal stenosis, lumbar region without neurogenic claudication: ICD-10-CM

## 2018-01-30 DIAGNOSIS — R972 Elevated prostate specific antigen [PSA]: ICD-10-CM

## 2018-01-30 DIAGNOSIS — I2510 Atherosclerotic heart disease of native coronary artery without angina pectoris: Secondary | ICD-10-CM

## 2018-01-30 DIAGNOSIS — E785 Hyperlipidemia, unspecified: ICD-10-CM

## 2018-01-30 DIAGNOSIS — E119 Type 2 diabetes mellitus without complications: ICD-10-CM

## 2018-01-30 DIAGNOSIS — Z8551 Personal history of malignant neoplasm of bladder: ICD-10-CM

## 2018-01-30 DIAGNOSIS — G4733 Obstructive sleep apnea (adult) (pediatric): ICD-10-CM

## 2018-01-30 DIAGNOSIS — I25118 Atherosclerotic heart disease of native coronary artery with other forms of angina pectoris: Principal | ICD-10-CM

## 2018-01-30 DIAGNOSIS — E1159 Type 2 diabetes mellitus with other circulatory complications: ICD-10-CM

## 2018-01-30 DIAGNOSIS — R0902 Hypoxemia: ICD-10-CM

## 2018-01-30 DIAGNOSIS — M1990 Unspecified osteoarthritis, unspecified site: ICD-10-CM

## 2018-01-30 DIAGNOSIS — I272 Pulmonary hypertension, unspecified: ICD-10-CM

## 2018-01-30 DIAGNOSIS — E669 Obesity, unspecified: ICD-10-CM

## 2018-01-30 DIAGNOSIS — Z7901 Long term (current) use of anticoagulants: ICD-10-CM

## 2018-01-30 DIAGNOSIS — K219 Gastro-esophageal reflux disease without esophagitis: ICD-10-CM

## 2018-01-30 DIAGNOSIS — M5106 Intervertebral disc disorders with myelopathy, lumbar region: ICD-10-CM

## 2018-01-30 DIAGNOSIS — I482 Chronic atrial fibrillation, unspecified: ICD-10-CM

## 2018-01-30 MED ORDER — METOLAZONE 2.5 MG PO TAB
2.5 mg | ORAL_TABLET | ORAL | 0 refills | 84.00000 days | Status: AC | PRN
Start: 2018-01-30 — End: 2018-12-04

## 2018-01-30 MED ORDER — PERFLUTREN LIPID MICROSPHERES 1.1 MG/ML IV SUSP
1-20 mL | Freq: Once | INTRAVENOUS | 0 refills | Status: CP | PRN
Start: 2018-01-30 — End: ?

## 2018-02-04 ENCOUNTER — Encounter: Admit: 2018-02-04 | Discharge: 2018-02-04 | Payer: MEDICARE

## 2018-02-05 ENCOUNTER — Encounter: Admit: 2018-02-05 | Discharge: 2018-02-05 | Payer: MEDICARE

## 2018-03-12 ENCOUNTER — Encounter: Admit: 2018-03-12 | Discharge: 2018-03-12 | Payer: MEDICARE

## 2018-03-12 DIAGNOSIS — I2510 Atherosclerotic heart disease of native coronary artery without angina pectoris: Principal | ICD-10-CM

## 2018-03-12 MED ORDER — ISOSORBIDE MONONITRATE 30 MG PO TB24
30 mg | ORAL_TABLET | Freq: Every morning | ORAL | 1 refills | 30.00000 days | Status: AC
Start: 2018-03-12 — End: 2018-06-03

## 2018-03-12 MED ORDER — METOPROLOL SUCCINATE 50 MG PO TB24
50 mg | ORAL_TABLET | Freq: Two times a day (BID) | ORAL | 1 refills | 90.00000 days | Status: AC
Start: 2018-03-12 — End: 2018-06-17

## 2018-06-03 ENCOUNTER — Encounter: Admit: 2018-06-03 | Discharge: 2018-06-03 | Payer: MEDICARE

## 2018-06-03 DIAGNOSIS — I2510 Atherosclerotic heart disease of native coronary artery without angina pectoris: Principal | ICD-10-CM

## 2018-06-03 MED ORDER — ISOSORBIDE MONONITRATE 30 MG PO TB24
30 mg | ORAL_TABLET | Freq: Every morning | ORAL | 11 refills | 90.00000 days | Status: AC
Start: 2018-06-03 — End: 2018-06-04

## 2018-06-04 ENCOUNTER — Encounter: Admit: 2018-06-04 | Discharge: 2018-06-04 | Payer: MEDICARE

## 2018-06-04 DIAGNOSIS — I2510 Atherosclerotic heart disease of native coronary artery without angina pectoris: Principal | ICD-10-CM

## 2018-06-04 MED ORDER — ISOSORBIDE MONONITRATE 30 MG PO TB24
30 mg | ORAL_TABLET | Freq: Every morning | ORAL | 6 refills | 90.00000 days | Status: AC
Start: 2018-06-04 — End: 2019-01-23

## 2018-06-08 ENCOUNTER — Encounter: Admit: 2018-06-08 | Discharge: 2018-06-08 | Payer: MEDICARE

## 2018-06-08 ENCOUNTER — Emergency Department: Admit: 2018-06-08 | Discharge: 2018-06-09 | Disposition: A | Discharge status: Home / Self Care | Payer: MEDICARE

## 2018-06-08 MED ORDER — CLINDAMYCIN IN 5 % DEXTROSE 600 MG/50 ML IV PGBK
600 mg | Freq: Once | INTRAVENOUS | 0 refills | Status: DC
Start: 2018-06-08 — End: 2018-06-09

## 2018-06-08 MED ORDER — ALBUTEROL SULFATE 2.5 MG/0.5 ML IN NEBU
2.5 mg | RESPIRATORY_TRACT | 0 refills | Status: CN | PRN
Start: 2018-06-08 — End: ?

## 2018-06-08 MED ORDER — LIDOCAINE-EPINEPHRINE 1 %-1:100,000 IJ SOLN
20 mL | Freq: Once | INTRAMUSCULAR | 0 refills | Status: CP
Start: 2018-06-08 — End: ?
  Administered 2018-06-09: 04:00:00 20 mL via INTRAMUSCULAR

## 2018-06-08 MED ORDER — SPIRONOLACTONE 25 MG PO TAB
25 mg | Freq: Every day | ORAL | 0 refills | Status: CN
Start: 2018-06-08 — End: ?

## 2018-06-08 MED ORDER — CLOPIDOGREL 75 MG PO TAB
75 mg | Freq: Every day | ORAL | 0 refills | Status: CN
Start: 2018-06-08 — End: ?

## 2018-06-08 MED ORDER — DOXYCYCLINE IVPB
100 mg | Freq: Once | INTRAVENOUS | 0 refills | Status: CP
Start: 2018-06-08 — End: ?
  Administered 2018-06-09 (×2): 100 mg via INTRAVENOUS

## 2018-06-08 MED ORDER — BUMETANIDE 1 MG PO TAB
2 mg | Freq: Two times a day (BID) | ORAL | 0 refills | Status: CN
Start: 2018-06-08 — End: ?

## 2018-06-08 MED ORDER — METOPROLOL SUCCINATE 50 MG PO TB24
50 mg | Freq: Two times a day (BID) | ORAL | 0 refills | Status: CN
Start: 2018-06-08 — End: ?

## 2018-06-08 MED ORDER — METFORMIN 500 MG PO TAB
500 mg | Freq: Two times a day (BID) | ORAL | 0 refills | Status: CN
Start: 2018-06-08 — End: ?

## 2018-06-08 MED ORDER — SODIUM CHLORIDE 0.9 % IR SOLN
Freq: Once | 0 refills | Status: CP
Start: 2018-06-08 — End: ?
  Administered 2018-06-09: 04:00:00 1000.000 mL

## 2018-06-08 MED ORDER — ISOSORBIDE MONONITRATE 30 MG PO TB24
30 mg | Freq: Every morning | ORAL | 0 refills | Status: CN
Start: 2018-06-08 — End: ?

## 2018-06-08 MED ORDER — MORPHINE 2 MG/ML IV CRTG
4 mg | Freq: Once | INTRAVENOUS | 0 refills | Status: CP
Start: 2018-06-08 — End: ?
  Administered 2018-06-09: 03:00:00 4 mg via INTRAVENOUS

## 2018-06-09 ENCOUNTER — Encounter: Admit: 2018-06-09 | Discharge: 2018-06-09 | Payer: MEDICARE

## 2018-06-09 ENCOUNTER — Emergency Department: Admit: 2018-06-08 | Discharge: 2018-06-08 | Payer: MEDICARE

## 2018-06-09 DIAGNOSIS — L0211 Cutaneous abscess of neck: Principal | ICD-10-CM

## 2018-06-09 DIAGNOSIS — E1165 Type 2 diabetes mellitus with hyperglycemia: ICD-10-CM

## 2018-06-09 DIAGNOSIS — E871 Hypo-osmolality and hyponatremia: ICD-10-CM

## 2018-06-09 DIAGNOSIS — Z7901 Long term (current) use of anticoagulants: ICD-10-CM

## 2018-06-09 LAB — COMPREHENSIVE METABOLIC PANEL
Lab: 0.7 mg/dL (ref 0.3–1.2)
Lab: 1.1 mg/dL (ref 0.4–1.24)
Lab: 136 MMOL/L — ABNORMAL LOW (ref 137–147)
Lab: 23 U/L (ref 7–56)
Lab: 256 mg/dL — ABNORMAL HIGH (ref 70–100)
Lab: 26 mg/dL — ABNORMAL HIGH (ref 7–25)
Lab: 28 U/L (ref 7–40)
Lab: 3.8 g/dL (ref 3.5–5.0)
Lab: 65 U/L (ref 25–110)
Lab: 7.2 g/dL (ref 6.0–8.0)
Lab: 9.1 mg/dL (ref 8.5–10.6)
Lab: >60 mL/min (ref >60)
Lab: >60 mL/min (ref >60)

## 2018-06-09 LAB — CBC AND DIFF
Lab: 0 10*3/uL (ref 0–0.20)
Lab: 0.3 10*3/uL (ref 0–0.45)
Lab: 10 10*3/uL (ref 4.5–11.0)

## 2018-06-09 LAB — GRAM STAIN

## 2018-06-09 LAB — SED RATE: Lab: 41 mm/h — ABNORMAL HIGH (ref 0–20)

## 2018-06-09 LAB — C REACTIVE PROTEIN (CRP): Lab: 1 mg/dL — ABNORMAL HIGH (ref <1.0)

## 2018-06-09 MED ORDER — FENTANYL CITRATE (PF) 50 MCG/ML IJ SOLN
50 ug | Freq: Once | INTRAVENOUS | 0 refills | Status: CP
Start: 2018-06-09 — End: ?

## 2018-06-09 MED ORDER — DOXYCYCLINE MONOHYDRATE 100 MG PO TAB
100 mg | ORAL_TABLET | Freq: Two times a day (BID) | ORAL | 0 refills | 8.00000 days | Status: AC
Start: 2018-06-09 — End: ?

## 2018-06-09 MED ADMIN — FENTANYL CITRATE (PF) 50 MCG/ML IJ SOLN [3037]: 50 ug | INTRAVENOUS | @ 07:00:00 | Stop: 2018-06-09 | NDC 00409909412

## 2018-06-13 LAB — CULTURE-ANAEROBIC

## 2018-06-13 LAB — CULTURE-WOUND/TISSUE/FLUID(AEROBIC ONLY)W/SENSITIVITY

## 2018-06-17 ENCOUNTER — Ambulatory Visit: Admit: 2018-06-17 | Discharge: 2018-06-18 | Payer: MEDICARE

## 2018-06-17 ENCOUNTER — Encounter: Admit: 2018-06-17 | Discharge: 2018-06-17 | Payer: MEDICARE

## 2018-06-17 DIAGNOSIS — M4307 Spondylolysis, lumbosacral region: ICD-10-CM

## 2018-06-17 DIAGNOSIS — I10 Essential (primary) hypertension: ICD-10-CM

## 2018-06-17 DIAGNOSIS — M545 Low back pain: ICD-10-CM

## 2018-06-17 DIAGNOSIS — J449 Chronic obstructive pulmonary disease, unspecified: Secondary | ICD-10-CM

## 2018-06-17 DIAGNOSIS — M48061 Spinal stenosis, lumbar region without neurogenic claudication: ICD-10-CM

## 2018-06-17 DIAGNOSIS — Z7901 Long term (current) use of anticoagulants: ICD-10-CM

## 2018-06-17 DIAGNOSIS — E782 Mixed hyperlipidemia: ICD-10-CM

## 2018-06-17 DIAGNOSIS — I208 Other forms of angina pectoris: ICD-10-CM

## 2018-06-17 DIAGNOSIS — I2510 Atherosclerotic heart disease of native coronary artery without angina pectoris: ICD-10-CM

## 2018-06-17 DIAGNOSIS — I25118 Atherosclerotic heart disease of native coronary artery with other forms of angina pectoris: Principal | ICD-10-CM

## 2018-06-17 DIAGNOSIS — K219 Gastro-esophageal reflux disease without esophagitis: ICD-10-CM

## 2018-06-17 DIAGNOSIS — I519 Heart disease, unspecified: ICD-10-CM

## 2018-06-17 DIAGNOSIS — Z9981 Dependence on supplemental oxygen: ICD-10-CM

## 2018-06-17 DIAGNOSIS — G4733 Obstructive sleep apnea (adult) (pediatric): ICD-10-CM

## 2018-06-17 DIAGNOSIS — E119 Type 2 diabetes mellitus without complications: ICD-10-CM

## 2018-06-17 DIAGNOSIS — I272 Pulmonary hypertension, unspecified: ICD-10-CM

## 2018-06-17 DIAGNOSIS — R0902 Hypoxemia: ICD-10-CM

## 2018-06-17 DIAGNOSIS — I4891 Unspecified atrial fibrillation: Principal | ICD-10-CM

## 2018-06-17 DIAGNOSIS — M1990 Unspecified osteoarthritis, unspecified site: ICD-10-CM

## 2018-06-17 DIAGNOSIS — I200 Unstable angina: ICD-10-CM

## 2018-06-17 DIAGNOSIS — Z8551 Personal history of malignant neoplasm of bladder: ICD-10-CM

## 2018-06-17 DIAGNOSIS — E785 Hyperlipidemia, unspecified: ICD-10-CM

## 2018-06-17 DIAGNOSIS — R972 Elevated prostate specific antigen [PSA]: ICD-10-CM

## 2018-06-17 DIAGNOSIS — I482 Chronic atrial fibrillation, unspecified: ICD-10-CM

## 2018-06-17 DIAGNOSIS — E669 Obesity, unspecified: ICD-10-CM

## 2018-06-17 DIAGNOSIS — E1159 Type 2 diabetes mellitus with other circulatory complications: ICD-10-CM

## 2018-06-17 DIAGNOSIS — C44310 Basal cell carcinoma of skin of unspecified parts of face: ICD-10-CM

## 2018-06-17 DIAGNOSIS — M5106 Intervertebral disc disorders with myelopathy, lumbar region: ICD-10-CM

## 2018-06-17 MED ORDER — METOPROLOL SUCCINATE 50 MG PO TB24
50 mg | ORAL_TABLET | Freq: Two times a day (BID) | ORAL | 1 refills | 90.00000 days | Status: AC
Start: 2018-06-17 — End: 2018-09-09

## 2018-06-21 ENCOUNTER — Encounter: Admit: 2018-06-21 | Discharge: 2018-06-21 | Payer: MEDICARE

## 2018-07-04 LAB — LIPID PROFILE
Lab: 100
Lab: 213 — ABNORMAL HIGH (ref 30–150)
Lab: 3.2
Lab: 45
Lab: 57 — ABNORMAL LOW (ref 60–99)

## 2018-07-04 LAB — COMPREHENSIVE METABOLIC PANEL
Lab: 1
Lab: 1.1
Lab: 136
Lab: 17
Lab: 20
Lab: 211 — ABNORMAL HIGH (ref 60–99)
Lab: 28
Lab: 3.3 — ABNORMAL LOW (ref 3.4–5.0)
Lab: 3.7
Lab: 32
Lab: 61
Lab: 7.1
Lab: 8.7 — ABNORMAL LOW (ref 9.0–10.7)

## 2018-09-09 ENCOUNTER — Encounter: Admit: 2018-09-09 | Discharge: 2018-09-09 | Payer: MEDICARE

## 2018-09-09 DIAGNOSIS — I25118 Atherosclerotic heart disease of native coronary artery with other forms of angina pectoris: Principal | ICD-10-CM

## 2018-09-09 DIAGNOSIS — I482 Chronic atrial fibrillation, unspecified: ICD-10-CM

## 2018-09-09 DIAGNOSIS — I208 Other forms of angina pectoris: ICD-10-CM

## 2018-09-09 DIAGNOSIS — I519 Heart disease, unspecified: ICD-10-CM

## 2018-09-09 MED ORDER — METOPROLOL SUCCINATE 50 MG PO TB24
50 mg | ORAL_TABLET | Freq: Two times a day (BID) | ORAL | 3 refills | 90.00000 days | Status: AC
Start: 2018-09-09 — End: 2019-09-04

## 2018-09-24 ENCOUNTER — Encounter: Admit: 2018-09-24 | Discharge: 2018-09-24 | Payer: MEDICARE

## 2018-09-24 MED ORDER — CLOPIDOGREL 75 MG PO TAB
75 mg | ORAL_TABLET | Freq: Every day | ORAL | 3 refills | 90.00000 days | Status: AC
Start: 2018-09-24 — End: 2019-10-15

## 2018-11-28 ENCOUNTER — Encounter: Admit: 2018-11-28 | Discharge: 2018-11-28 | Payer: MEDICARE

## 2018-11-28 NOTE — Progress Notes
Records Request    Medical records request for continuation of care:    Patient has appointment on 6.3.20   with  Dr. Glean Hess .    Please fax records to [facility]  [phone]    Request records:      Recent Labs          Thank you,      [facility]  [facility]  [address]  [place], [place] [address]  Phone:  [phone]  Fax:  [phone]

## 2018-12-04 ENCOUNTER — Encounter: Admit: 2018-12-04 | Discharge: 2018-12-04

## 2018-12-04 ENCOUNTER — Ambulatory Visit: Admit: 2018-12-04 | Discharge: 2018-12-05

## 2018-12-04 DIAGNOSIS — J449 Chronic obstructive pulmonary disease, unspecified: Secondary | ICD-10-CM

## 2018-12-04 DIAGNOSIS — I482 Chronic atrial fibrillation, unspecified: Secondary | ICD-10-CM

## 2018-12-04 DIAGNOSIS — M1990 Unspecified osteoarthritis, unspecified site: Secondary | ICD-10-CM

## 2018-12-04 DIAGNOSIS — G4733 Obstructive sleep apnea (adult) (pediatric): Secondary | ICD-10-CM

## 2018-12-04 DIAGNOSIS — I10 Essential (primary) hypertension: Secondary | ICD-10-CM

## 2018-12-04 DIAGNOSIS — R0902 Hypoxemia: Secondary | ICD-10-CM

## 2018-12-04 DIAGNOSIS — M5106 Intervertebral disc disorders with myelopathy, lumbar region: Secondary | ICD-10-CM

## 2018-12-04 DIAGNOSIS — Z9981 Dependence on supplemental oxygen: Secondary | ICD-10-CM

## 2018-12-04 DIAGNOSIS — C44310 Basal cell carcinoma of skin of unspecified parts of face: Secondary | ICD-10-CM

## 2018-12-04 DIAGNOSIS — I25118 Atherosclerotic heart disease of native coronary artery with other forms of angina pectoris: Secondary | ICD-10-CM

## 2018-12-04 DIAGNOSIS — I5032 Chronic diastolic (congestive) heart failure: Secondary | ICD-10-CM

## 2018-12-04 DIAGNOSIS — I4891 Unspecified atrial fibrillation: Secondary | ICD-10-CM

## 2018-12-04 DIAGNOSIS — E785 Hyperlipidemia, unspecified: Secondary | ICD-10-CM

## 2018-12-04 DIAGNOSIS — I2510 Atherosclerotic heart disease of native coronary artery without angina pectoris: Secondary | ICD-10-CM

## 2018-12-04 DIAGNOSIS — M4307 Spondylolysis, lumbosacral region: Secondary | ICD-10-CM

## 2018-12-04 DIAGNOSIS — Z8551 Personal history of malignant neoplasm of bladder: Secondary | ICD-10-CM

## 2018-12-04 DIAGNOSIS — K219 Gastro-esophageal reflux disease without esophagitis: Secondary | ICD-10-CM

## 2018-12-04 DIAGNOSIS — M545 Low back pain: Secondary | ICD-10-CM

## 2018-12-04 DIAGNOSIS — E782 Mixed hyperlipidemia: Secondary | ICD-10-CM

## 2018-12-04 DIAGNOSIS — I272 Pulmonary hypertension, unspecified: Secondary | ICD-10-CM

## 2018-12-04 DIAGNOSIS — M48061 Spinal stenosis, lumbar region without neurogenic claudication: Secondary | ICD-10-CM

## 2018-12-04 DIAGNOSIS — R972 Elevated prostate specific antigen [PSA]: Secondary | ICD-10-CM

## 2018-12-04 DIAGNOSIS — E119 Type 2 diabetes mellitus without complications: Secondary | ICD-10-CM

## 2018-12-04 DIAGNOSIS — I208 Other forms of angina pectoris: Secondary | ICD-10-CM

## 2018-12-04 DIAGNOSIS — I200 Unstable angina: Secondary | ICD-10-CM

## 2018-12-04 DIAGNOSIS — Z7901 Long term (current) use of anticoagulants: Secondary | ICD-10-CM

## 2018-12-04 DIAGNOSIS — E669 Obesity, unspecified: Secondary | ICD-10-CM

## 2018-12-04 DIAGNOSIS — E1159 Type 2 diabetes mellitus with other circulatory complications: Secondary | ICD-10-CM

## 2018-12-04 NOTE — Progress Notes
Date of Service: 12/04/2018    Ian Velez is a 78 y.o. male.       HPI     This is a delightfully pleasant 78 year old male who was seen at the department of cardiovascular medicine clinic at the [place], [place] office today for ongoing cardiovascular care. ???He has known history of coronary artery disease, oxygen dependent COPD with prior 100-pack-year tobacco history having quit in 2010, chronic combined diastolic/mild systolic heart failure, diabetes on insulin, bladder cancer with prior surgery and chemotherapy, permanent atrial fibrillation on oral anticoagulation, obstructive sleep apnea on nocturnal BiPAP, hypertension and dyslipidemia. ???He originally presented to an outside hospital in February 2019 with neck and throat pain concerning for angina equivalency. ???He underwent coronary angiography there which revealed high-grade stenosis in the RCA and circumflex. ???He had 20 to 30% disease in the distal left main and proximal LAD. ???He was initially recommended to have surgical revascularization. ???He was felt to be too high risk for this to be performed locally and was referred to our institution. ???Ultimately, the decision was reached that percutaneous coronary intervention would be a better option for him overall. ???On Nov 07, 2017, he underwent complex, high risk PCI with attempts to open the chronically occluded RCA. ???Flow was reestablished to the proximal and midportion, but there was some dissection distal to the stented portion. ???This was unable to be intervened upon despite attempts. ???The occluded left circumflex was not attempted. ???Recommendations were for maximal medical therapy and if further lifestyle limiting symptoms, that reattempts on the circumflex and distal RCA could be entertained after an appropriate discussion.  ???  He returns the office today with no acute cardiovascular complaints. ???His wife is with him in the office today, and she does an excellent job of keeping a very thorough track of his weight, BP, blood sugar, and heart rate.  She also helps with administering his medications.  She reports that his weight has been stable, although perhaps up a few pounds.  He has been struggling with the closing of pulmonary rehab in his exercise pool with the ongoing viral pandemic.  He still occasionally gets his anginal equivalent (neck/throat discomfort) which is quickly relieved with a sublingual nitroglycerin.   He has maybe only use this twice in the last several months.  He has not required any extra metolazone for quite some time. He continues to abide by a low-salt diet. ???His chronic lower extremity edema has  been stable. ???He will occasionally get some mild leg cramping in the morning.  He has some chronic back pain related to arthritis.  He denies orthopnea or PND. ???He denies significant palpitations (albeit he is in permanent atrial fibrillation). ???He continues to utilize nocturnal BiPAP therapy for his obstructive sleep apnea. ???He does remain compliant with his medical therapy.    His most recent cardiovascular testing includes a resting echo Doppler study in July 2019.  His LVEF appears to be in the 55 to 60% range.  He remains in atrial fibrillation.  RV cavity size appears mildly enlarged with mildly reduced function.  Estimated pulmonary systolic pressure was 49 mmHg. 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 ???Actual FEV1 was 1.14 (42% of predicted). ???Carotid duplex ultrasound from February 2019 demonstrated no high-grade stenosis in the bilateral carotid arteries. ???Recent lab work from January 2020 was reviewed.  A BMP demonstrated creatinine 1.08, potassium 3.7, glucose 211 and an albumin of 3.3.  A fasting lipid profile demonstrated total  cholesterol 145, triglycerides 213, HDL 45, and LDL 57.         Vitals:    12/04/18 1330 12/04/18 1347   BP: 106/80 112/80 BP Source: Arm, Left Upper Arm, Right Upper   Pulse: 100    SpO2: 95%    Weight: 130.5 kg (287 lb 9.6 oz)    Height: 1.676 m (5' 6)    PainSc: Zero      Body mass index is 46.42 kg/m???.     Past Medical History  Patient Active Problem List    Diagnosis Date Noted   ??? Diastolic dysfunction with chronic heart failure (HCC) 01/11/2018   ??? Obesity, Class III, BMI 40-49.9 (morbid obesity) (HCC) 01/11/2018   ??? Pulmonary hypertension (HCC)    ??? Obstructive sleep apnea    ??? OA (osteoarthritis)    ??? Lumbosacral spondylolysis    ??? Hypoxemia    ??? Essential hypertension      Echo - 01/30/18 at [facility] - 1. Qualitatively normal LV size and function, EF ~ 55%; 2. Unable to accurately assess diastolic function due to underlying atrial fibrillation; 3. Qualitatively the RV appears mildly dilated with mildly reduced systolic function 4. Qualitatively the RA appears mildly dilated; 5. Valvular structures were not well visualized.  No significant Doppler abnormalities; 6. Estimated peak systolic PA pressure =  45 mmHg; 7. No pericardial effusion     ??? Mixed hyperlipidemia    ??? History of bladder carcinoma    ??? DM (diabetes mellitus), type 2 (HCC)    ??? Stage 3 severe COPD by GOLD classification (HCC)    ??? Chronic atrial fibrillation (HCC)    ??? Coronary artery disease of native artery of native heart with stable angina pectoris (HCC)      1. 11/07/2017 coronary angiogram  - Highly complex severe disease of the right coronary and circumflex with a complete occlusion of both the circumflex and right coronary artery with TIMI 1 flow distal in both arteries.Mild disease of the LAD and left main.Successful intervention of the right coronary, re-establishing normal flow.  There continues to be serial 90% stenoses in the distal right coronary artery, that are also heavily calcified.     ??? Chronic GERD    ??? On home oxygen therapy      3L     ??? Chronic stable angina (HCC)    ??? Chronic anticoagulation          Review of Systems Constitution: Positive for malaise/fatigue.   HENT: Positive for odynophagia.    Eyes: Negative.    Cardiovascular: Positive for dyspnea on exertion.   Respiratory: Positive for shortness of breath.    Endocrine: Negative.    Hematologic/Lymphatic: Bruises/bleeds easily.   Skin: Positive for skin cancer.   Musculoskeletal: Positive for back pain and muscle cramps.   Gastrointestinal: Positive for dysphagia.   Genitourinary: Negative.    Neurological: Positive for excessive daytime sleepiness.   Psychiatric/Behavioral: Negative.    Allergic/Immunologic: Negative.        Physical Exam  Nursing note???and vitals???reviewed.  Constitutional: He appears???well-developed???and well-nourished.???No distress.???  Consistent with BMI.  Nasal cannula supplemental oxygen in place.???  HENT:   Head:???Normocephalic???and atraumatic.   Mouth/Throat:???Oropharynx is clear and moist. No???oropharyngeal exudate.   Eyes:???Pupils are equal, round, and reactive to light.???Conjunctivae???and EOM???are normal. No scleral icterus.   Neck:???Normal range of motion.???No JVD???present. Carotid bruit is not present (bilaterally).   Cardiovascular:???Normal rate,???normal heart sounds???and intact distal pulses.???An irregularly irregular rhythm???present. Exam reveals no gallop???and no  friction rub.???  No murmur???heard.  Pulmonary/Chest:???Effort normal???and breath sounds normal. No???respiratory distress. He has???no wheezes. He has???no rales.   Abdominal:???Soft.???Bowel sounds are normal. He exhibits???no distension. There is???no tenderness. There is???no guarding.   Musculoskeletal:???Normal range of motion. He exhibits???edema???(trace to 1+ BLE with compression stockings in place. ???He has a previous traumatic injury to the left calf muscle.). He exhibits no???tenderness.   Lymphadenopathy:   ??????He has no cervical adenopathy.   Neurological: He is???alert???and oriented to person, place, and time. No???cranial nerve deficit. He exhibits???normal muscle tone.???Coordination???normal. Skin: Skin is???warm???and dry.???No rash???noted. He is not diaphoretic. No???erythema.   Psychiatric: He has a???normal mood and affect. His???behavior is normal.???  ???    Cardiovascular Studies      Problems Addressed Today  Encounter Diagnoses   Name Primary?   ??? Coronary artery disease of native artery of native heart with stable angina pectoris (HCC) Yes   ??? Chronic stable angina (HCC)    ??? Chronic atrial fibrillation (HCC)    ??? Diastolic dysfunction with chronic heart failure (HCC)    ??? Essential hypertension    ??? Mixed hyperlipidemia    ??? Stage 3 severe COPD by GOLD classification (HCC)    ??? Type 2 diabetes mellitus with other circulatory complication, with long-term current use of insulin (HCC)    ??? Obesity, Class III, BMI 40-49.9 (morbid obesity) (HCC)        Assessment and Plan     1. Coronary artery disease with chronic stable angina  ??? Please see results of his coronary anatomy and attempted intervention above. ???We are continuing to focus on maximal medical therapy. ???In the past his anginal equivalent has been throat/neck pain (not dysphagia) with exertion.  ??? We will continue medical therapy with clopidogrel 75 mg daily (in lieu of dual antiplatelet therapy as he is on oral anticoagulation) and atorvastatin 40 mg nightly  ??? He is on a small dose of isosorbide mononitrate as an antianginal  ??? He will continue current dose of metoprolol succinate for antianginal therapy as well  ???  2. Permanent atrial fibrillation  ??? His CHADs2-vasc score is 6 (systolic dysfunction, hypertension, age +47, diabetes, and vascular disease). ???He remains on oral anticoagulation with apixaban, which he is tolerating well at this time  ??? Continue metoprolol succinate, currently at 50 mg twice daily for rate control  ???  3. Chronic diastolic dysfunction without acute decompensation  ??? His propensity for volume overload is multifactorial, owing to his underlying lung disease and obstructive sleep apnea with chronic RV dysfunction as well. ??? Volume status appears relatively euvolemic overall on current diuretic therapy with bumetanide 2 mg twice daily and spironolactone 25 mg once daily.  ???  4. Essential hypertension  ??? Blood pressure is well controlled in the office today. ???No changes at this time  ???  5. Dyslipidemia  ??? His most recent fasting lipid panel was nearly optimal for his comorbid conditions  ??? Continue atorvastatin 40 mg nightly  ???  6. Type 2 diabetes  ??? He reports that his PCP recently made some adjustments to his insulin therapy.  His blood sugars are under the management of his primary care provider  ???  7. Stage III severe COPD by gold classification  ??? He remains tobacco free for almost a decade  ??? He is on bronchodilator therapy as directed by his pulmonologist  ???  8. BMI 46 kg/m???  ??? He will continue to benefit from long-term efforts at dietary changes and lifestyle  modifications    Patient's questions were answered and they agreed with the above plan.  Specific instructions were typed into their After Visit Summary document.  Follow-up in 6 months or sooner as needed.  Thank you for the opportunity to participate in the care of your patient.  Please call with questions or concerns.    Gloris Ham, MD, [facility]  Department of Cardiovascular Medicine  [facility]         Current Medications (including today's revisions)  ??? albuterol 0.5% (PROVENTIL; VENTOLIN) 2.5 mg/0.5 mL nebulizer solution Inhale 2.5 mg solution by nebulizer as directed every 4 hours as needed for Shortness of Breath or Wheezing.   ??? ALPRAZolam (XANAX) 0.25 mg tablet Take 0.25 mg by mouth daily as needed for Anxiety.   ??? atorvastatin (LIPITOR) 40 mg tablet Take 40 mg by mouth daily.   ??? bumetanide (BUMEX) 2 mg tablet Take one tablet by mouth twice daily.   ??? canagliflozin (INVOKANA) 300 mg tablet Take 300 mg by mouth every 24 hours.   ??? clopiDOGrel (PLAVIX) 75 mg tablet Take one tablet by mouth daily. ??? ELDERBERRY FRUIT PO Take 1 tablet by mouth daily.   ??? HYDROcodone/acetaminophen (NORCO) 5/325 mg tablet Take 1 tablet by mouth as Needed for Pain    ??? Insulin Lisp & Lisp Prot (Hum) (HUMALOG MIX 50-50 KWIKPEN) 100 unit/mL (50-50) inpn Inject 70 Units under the skin three times daily.   ??? isosorbide mononitrate SR (IMDUR) 30 mg tablet Take one tablet by mouth every morning.   ??? metFORMIN (GLUCOPHAGE) 500 mg tablet Take one tablet by mouth twice daily with meals. Hold 48 hours after cath procedure.  Resume with evening dose on 11/09/17   ??? metoprolol XL (TOPROL XL) 50 mg extended release tablet Take one tablet by mouth twice daily.   ??? montelukast (SINGULAIR) 10 mg tablet Take 10 mg by mouth at bedtime daily.   ??? MULTIVITAMIN PO Take 1 tablet by mouth daily.   ??? nitroglycerin (NITROSTAT) 0.4 mg tablet Place 0.4 mg under tongue every 5 minutes as needed for Chest Pain. Max of 3 tablets, call 911.   ??? potassium chloride SR (K-DUR) 20 mEq tablet Take 20 mEq by mouth three times daily. Take with a meal and a full glass of water.    ??? rivaroxaban (XARELTO) 20 mg tablet Take 20 mg by mouth daily. Take with food.   ??? spironolactone (ALDACTONE) 25 mg tablet Take one tablet by mouth daily. Take with food. (Patient taking differently: Take 25 mg by mouth twice daily. Take with food.)   ??? tiotropium (SPIRIVA WITH HANDIHALER) 18 mcg capsule for inhaler Place 18 mcg into inhaler and inhale into lungs as directed daily.

## 2019-01-01 ENCOUNTER — Encounter: Admit: 2019-01-01 | Discharge: 2019-01-01

## 2019-01-01 DIAGNOSIS — I519 Heart disease, unspecified: Secondary | ICD-10-CM

## 2019-01-01 MED ORDER — SPIRONOLACTONE 25 MG PO TAB
25 mg | ORAL_TABLET | Freq: Every day | ORAL | 3 refills | 90.00000 days | Status: DC
Start: 2019-01-01 — End: 2019-12-31

## 2019-01-02 ENCOUNTER — Encounter: Admit: 2019-01-02 | Discharge: 2019-01-02

## 2019-01-02 MED ORDER — NITROGLYCERIN 0.4 MG SL SUBL
.4 mg | ORAL_TABLET | SUBLINGUAL | 3 refills | 9.00000 days | Status: AC | PRN
Start: 2019-01-02 — End: ?

## 2019-01-02 NOTE — Telephone Encounter
-----   Message from Grier Rocher, RN sent at 01/02/2019  1:02 PM CDT -----  Regarding: FW: Prescription Question  Contact: [phone]    ----- Message -----  From: Kandice Moos  Sent: 01/02/2019  10:47 AM CDT  To: [facility] T[facility] [place]  Subject: Prescription Question                            Good Morning;    So sorry to bother you again, but I just discovered I can no longer refill my 'Nitroglycerin 0.4 MG Tablet SL' .  (I am no longer seeing Dr. Aida Puffer in [place])  When I saw Dr. Pablo Ledger a few weeks ago we didn't give the Nitro tabs refill a thought.   Would you please ask Dr. Pablo Ledger if he would send a new precription to our pharmacy in [place]?     I had a prescription for 100 tablets (in Feb. 2019) & [facility] would give me 25 tablets at a time so I would have a fresh supply as (at this time) I only need to use occasionally. At this time I only have 8 tablets left.     If Dr. Parthenia Ames will fill this prescription please send to: [facility] , [place]. [address] [place] [address] , Fax# [phone] / Bus.# [phone]    If this request is not possible please let me know.   Thank You so much for your time  May your Holiday be a safe one    Ian Velez   DOB: 1941-04-26  Home# [phone]  (wife's cell # [phone])

## 2019-01-02 NOTE — Telephone Encounter
Refill sent as requested.

## 2019-01-23 ENCOUNTER — Encounter: Admit: 2019-01-23 | Discharge: 2019-01-23

## 2019-01-23 DIAGNOSIS — I2510 Atherosclerotic heart disease of native coronary artery without angina pectoris: Secondary | ICD-10-CM

## 2019-01-23 MED ORDER — ISOSORBIDE MONONITRATE 30 MG PO TB24
30 mg | ORAL_TABLET | Freq: Every morning | ORAL | 6 refills | 30.00000 days | Status: DC
Start: 2019-01-23 — End: 2019-07-28

## 2019-01-30 ENCOUNTER — Encounter: Admit: 2019-01-30 | Discharge: 2019-01-30

## 2019-01-30 MED ORDER — ATORVASTATIN 40 MG PO TAB
40 mg | ORAL_TABLET | Freq: Every day | ORAL | 1 refills | Status: DC
Start: 2019-01-30 — End: 2019-07-30

## 2019-07-25 ENCOUNTER — Encounter: Admit: 2019-07-25 | Discharge: 2019-07-25 | Payer: MEDICARE

## 2019-07-25 NOTE — Progress Notes
Records Request    Medical records request for continuation of care:    Patient has appointment on 1.27.2021   with  Dr. Pablo Ledger .    Please fax records to [facility] [place]    Request records:        Recent Labs        Thank you,      Cardiovascular Medicine  [facility]  [address] [place] [address]  Phone:  [phone]  Fax:  [phone]

## 2019-07-28 ENCOUNTER — Encounter: Admit: 2019-07-28 | Discharge: 2019-07-28 | Payer: MEDICARE

## 2019-07-28 DIAGNOSIS — I2510 Atherosclerotic heart disease of native coronary artery without angina pectoris: Secondary | ICD-10-CM

## 2019-07-28 MED ORDER — ISOSORBIDE MONONITRATE 30 MG PO TB24
30 mg | ORAL_TABLET | Freq: Every morning | ORAL | 1 refills | 30.00000 days | Status: DC
Start: 2019-07-28 — End: 2019-07-30

## 2019-07-30 ENCOUNTER — Encounter: Admit: 2019-07-30 | Discharge: 2019-07-30 | Payer: MEDICARE

## 2019-07-30 DIAGNOSIS — M4307 Spondylolysis, lumbosacral region: Secondary | ICD-10-CM

## 2019-07-30 DIAGNOSIS — M5106 Intervertebral disc disorders with myelopathy, lumbar region: Secondary | ICD-10-CM

## 2019-07-30 DIAGNOSIS — E669 Obesity, unspecified: Secondary | ICD-10-CM

## 2019-07-30 DIAGNOSIS — I25118 Atherosclerotic heart disease of native coronary artery with other forms of angina pectoris: Secondary | ICD-10-CM

## 2019-07-30 DIAGNOSIS — I4891 Unspecified atrial fibrillation: Secondary | ICD-10-CM

## 2019-07-30 DIAGNOSIS — I5032 Chronic diastolic (congestive) heart failure: Secondary | ICD-10-CM

## 2019-07-30 DIAGNOSIS — G4733 Obstructive sleep apnea (adult) (pediatric): Secondary | ICD-10-CM

## 2019-07-30 DIAGNOSIS — M48061 Spinal stenosis, lumbar region without neurogenic claudication: Secondary | ICD-10-CM

## 2019-07-30 DIAGNOSIS — I200 Unstable angina: Secondary | ICD-10-CM

## 2019-07-30 DIAGNOSIS — E785 Hyperlipidemia, unspecified: Secondary | ICD-10-CM

## 2019-07-30 DIAGNOSIS — I2510 Atherosclerotic heart disease of native coronary artery without angina pectoris: Secondary | ICD-10-CM

## 2019-07-30 DIAGNOSIS — E119 Type 2 diabetes mellitus without complications: Secondary | ICD-10-CM

## 2019-07-30 DIAGNOSIS — K219 Gastro-esophageal reflux disease without esophagitis: Secondary | ICD-10-CM

## 2019-07-30 DIAGNOSIS — J449 Chronic obstructive pulmonary disease, unspecified: Secondary | ICD-10-CM

## 2019-07-30 DIAGNOSIS — I10 Essential (primary) hypertension: Secondary | ICD-10-CM

## 2019-07-30 DIAGNOSIS — Z9981 Dependence on supplemental oxygen: Secondary | ICD-10-CM

## 2019-07-30 DIAGNOSIS — M545 Low back pain: Secondary | ICD-10-CM

## 2019-07-30 DIAGNOSIS — E1159 Type 2 diabetes mellitus with other circulatory complications: Secondary | ICD-10-CM

## 2019-07-30 DIAGNOSIS — Z7901 Long term (current) use of anticoagulants: Secondary | ICD-10-CM

## 2019-07-30 DIAGNOSIS — Z8551 Personal history of malignant neoplasm of bladder: Secondary | ICD-10-CM

## 2019-07-30 DIAGNOSIS — I482 Chronic atrial fibrillation, unspecified: Secondary | ICD-10-CM

## 2019-07-30 DIAGNOSIS — R972 Elevated prostate specific antigen [PSA]: Secondary | ICD-10-CM

## 2019-07-30 DIAGNOSIS — C44310 Basal cell carcinoma of skin of unspecified parts of face: Secondary | ICD-10-CM

## 2019-07-30 DIAGNOSIS — M1990 Unspecified osteoarthritis, unspecified site: Secondary | ICD-10-CM

## 2019-07-30 DIAGNOSIS — E782 Mixed hyperlipidemia: Secondary | ICD-10-CM

## 2019-07-30 DIAGNOSIS — R0902 Hypoxemia: Secondary | ICD-10-CM

## 2019-07-30 DIAGNOSIS — I272 Pulmonary hypertension, unspecified: Secondary | ICD-10-CM

## 2019-07-30 MED ORDER — ISOSORBIDE MONONITRATE 30 MG PO TB24
30 mg | ORAL_TABLET | Freq: Every morning | ORAL | 3 refills | 30.00000 days | Status: AC
Start: 2019-07-30 — End: ?

## 2019-07-30 MED ORDER — ATORVASTATIN 40 MG PO TAB
40 mg | ORAL_TABLET | Freq: Every day | ORAL | 3 refills | Status: AC
Start: 2019-07-30 — End: ?
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
## 2019-07-31 ENCOUNTER — Encounter: Admit: 2019-07-31 | Discharge: 2019-07-31 | Payer: MEDICARE

## 2019-09-04 ENCOUNTER — Encounter: Admit: 2019-09-04 | Discharge: 2019-09-04 | Payer: MEDICARE

## 2019-09-04 DIAGNOSIS — I208 Other forms of angina pectoris: Secondary | ICD-10-CM

## 2019-09-04 DIAGNOSIS — I519 Heart disease, unspecified: Secondary | ICD-10-CM

## 2019-09-04 DIAGNOSIS — I25118 Atherosclerotic heart disease of native coronary artery with other forms of angina pectoris: Secondary | ICD-10-CM

## 2019-09-04 DIAGNOSIS — I482 Chronic atrial fibrillation, unspecified: Secondary | ICD-10-CM

## 2019-09-04 MED ORDER — METOPROLOL SUCCINATE 50 MG PO TB24
50 mg | ORAL_TABLET | Freq: Two times a day (BID) | ORAL | 3 refills | 90.00000 days | Status: AC
Start: 2019-09-04 — End: ?

## 2019-10-15 ENCOUNTER — Encounter: Admit: 2019-10-15 | Discharge: 2019-10-15 | Payer: MEDICARE

## 2019-10-15 MED ORDER — CLOPIDOGREL 75 MG PO TAB
75 mg | ORAL_TABLET | Freq: Every day | ORAL | 3 refills | 90.00000 days | Status: AC
Start: 2019-10-15 — End: ?

## 2019-12-31 ENCOUNTER — Encounter: Admit: 2019-12-31 | Discharge: 2019-12-31 | Payer: MEDICARE

## 2019-12-31 DIAGNOSIS — I519 Heart disease, unspecified: Secondary | ICD-10-CM

## 2019-12-31 MED ORDER — SPIRONOLACTONE 25 MG PO TAB
25 mg | ORAL_TABLET | Freq: Every day | ORAL | 1 refills | 90.00000 days | Status: AC
Start: 2019-12-31 — End: ?

## 2020-01-08 ENCOUNTER — Encounter: Admit: 2020-01-08 | Discharge: 2020-01-08 | Payer: MEDICARE

## 2020-01-08 NOTE — Progress Notes
Records Request    Trig Mcbryar DOB: [birth year]    Medical records request for continuation of care:    Patient has appointment on 01/16/20 with Dr. Glean Hess.    Please fax records to Cardiovascular Medicine [place] [facility] [phone]    Request records:    Recent Labs    Thank you,      Cardiovascular Medicine  [facility]  [address]  [place], [place] [address]  Phone:  [phone]  Fax:  [phone]

## 2020-01-09 ENCOUNTER — Encounter: Admit: 2020-01-09 | Discharge: 2020-01-09 | Payer: MEDICARE

## 2020-01-16 ENCOUNTER — Encounter: Admit: 2020-01-16 | Discharge: 2020-01-16 | Payer: MEDICARE

## 2020-01-16 DIAGNOSIS — I482 Chronic atrial fibrillation, unspecified: Secondary | ICD-10-CM

## 2020-01-16 DIAGNOSIS — Z9981 Dependence on supplemental oxygen: Secondary | ICD-10-CM

## 2020-01-16 DIAGNOSIS — M1990 Unspecified osteoarthritis, unspecified site: Secondary | ICD-10-CM

## 2020-01-16 DIAGNOSIS — I2510 Atherosclerotic heart disease of native coronary artery without angina pectoris: Secondary | ICD-10-CM

## 2020-01-16 DIAGNOSIS — Z8551 Personal history of malignant neoplasm of bladder: Secondary | ICD-10-CM

## 2020-01-16 DIAGNOSIS — I10 Essential (primary) hypertension: Secondary | ICD-10-CM

## 2020-01-16 DIAGNOSIS — K219 Gastro-esophageal reflux disease without esophagitis: Secondary | ICD-10-CM

## 2020-01-16 DIAGNOSIS — E785 Hyperlipidemia, unspecified: Secondary | ICD-10-CM

## 2020-01-16 DIAGNOSIS — I200 Unstable angina: Secondary | ICD-10-CM

## 2020-01-16 DIAGNOSIS — I5032 Chronic diastolic (congestive) heart failure: Secondary | ICD-10-CM

## 2020-01-16 DIAGNOSIS — E1159 Type 2 diabetes mellitus with other circulatory complications: Secondary | ICD-10-CM

## 2020-01-16 DIAGNOSIS — M545 Low back pain: Secondary | ICD-10-CM

## 2020-01-16 DIAGNOSIS — G4733 Obstructive sleep apnea (adult) (pediatric): Secondary | ICD-10-CM

## 2020-01-16 DIAGNOSIS — I4891 Unspecified atrial fibrillation: Secondary | ICD-10-CM

## 2020-01-16 DIAGNOSIS — M4307 Spondylolysis, lumbosacral region: Secondary | ICD-10-CM

## 2020-01-16 DIAGNOSIS — I272 Pulmonary hypertension, unspecified: Secondary | ICD-10-CM

## 2020-01-16 DIAGNOSIS — M48061 Spinal stenosis, lumbar region without neurogenic claudication: Secondary | ICD-10-CM

## 2020-01-16 DIAGNOSIS — I208 Other forms of angina pectoris: Secondary | ICD-10-CM

## 2020-01-16 DIAGNOSIS — R972 Elevated prostate specific antigen [PSA]: Secondary | ICD-10-CM

## 2020-01-16 DIAGNOSIS — C44310 Basal cell carcinoma of skin of unspecified parts of face: Secondary | ICD-10-CM

## 2020-01-16 DIAGNOSIS — J449 Chronic obstructive pulmonary disease, unspecified: Secondary | ICD-10-CM

## 2020-01-16 DIAGNOSIS — R0902 Hypoxemia: Secondary | ICD-10-CM

## 2020-01-16 DIAGNOSIS — E669 Obesity, unspecified: Secondary | ICD-10-CM

## 2020-01-16 DIAGNOSIS — I25118 Atherosclerotic heart disease of native coronary artery with other forms of angina pectoris: Secondary | ICD-10-CM

## 2020-01-16 DIAGNOSIS — Z7901 Long term (current) use of anticoagulants: Secondary | ICD-10-CM

## 2020-01-16 DIAGNOSIS — E119 Type 2 diabetes mellitus without complications: Secondary | ICD-10-CM

## 2020-01-16 DIAGNOSIS — E782 Mixed hyperlipidemia: Secondary | ICD-10-CM

## 2020-01-16 DIAGNOSIS — M5106 Intervertebral disc disorders with myelopathy, lumbar region: Secondary | ICD-10-CM
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
## 2020-03-24 ENCOUNTER — Encounter: Admit: 2020-03-24 | Discharge: 2020-03-24 | Payer: MEDICARE

## 2020-06-28 ENCOUNTER — Encounter: Admit: 2020-06-28 | Discharge: 2020-06-28 | Payer: MEDICARE

## 2020-06-28 DIAGNOSIS — I519 Heart disease, unspecified: Secondary | ICD-10-CM

## 2020-06-28 MED ORDER — SPIRONOLACTONE 25 MG PO TAB
25 mg | ORAL_TABLET | Freq: Every day | ORAL | 3 refills | 46.00000 days | Status: AC
Start: 2020-06-28 — End: ?

## 2020-07-26 ENCOUNTER — Encounter: Admit: 2020-07-26 | Discharge: 2020-07-26 | Payer: MEDICARE

## 2020-07-26 DIAGNOSIS — I25118 Atherosclerotic heart disease of native coronary artery with other forms of angina pectoris: Secondary | ICD-10-CM

## 2020-07-26 DIAGNOSIS — E782 Mixed hyperlipidemia: Secondary | ICD-10-CM

## 2020-07-26 MED ORDER — ATORVASTATIN 40 MG PO TAB
40 mg | ORAL_TABLET | Freq: Every day | ORAL | 3 refills | Status: AC
Start: 2020-07-26 — End: ?

## 2020-07-26 MED ORDER — ATORVASTATIN 40 MG PO TAB
40 mg | ORAL_TABLET | Freq: Every day | ORAL | 3 refills | Status: DC
Start: 2020-07-26 — End: 2020-07-26

## 2020-07-26 NOTE — Telephone Encounter
Medication called into [facility]. Read back and verified by pharmacist.

## 2020-08-10 ENCOUNTER — Encounter: Admit: 2020-08-10 | Discharge: 2020-08-10 | Payer: MEDICARE

## 2020-08-10 NOTE — Progress Notes
Records Request    Medical records request for continuation of care:    Patient has appointment on 08/13/2020   with  Dr. Kevan Ny Kvapil* .    Please fax records to [place] [facility] [place]    Request records: STAT              Recent Labs (CMP, Lipid Panel)                  Thank you,      Cardiovascular Medicine  [facility]  [address] [place] [address]  Phone:  [phone]  Fax:  [phone]

## 2020-08-13 ENCOUNTER — Encounter

## 2020-08-13 DIAGNOSIS — E669 Obesity, unspecified: Secondary | ICD-10-CM

## 2020-08-13 DIAGNOSIS — I4891 Unspecified atrial fibrillation: Secondary | ICD-10-CM

## 2020-08-13 DIAGNOSIS — M4307 Spondylolysis, lumbosacral region: Secondary | ICD-10-CM

## 2020-08-13 DIAGNOSIS — K219 Gastro-esophageal reflux disease without esophagitis: Secondary | ICD-10-CM

## 2020-08-13 DIAGNOSIS — I200 Unstable angina: Secondary | ICD-10-CM

## 2020-08-13 DIAGNOSIS — M1990 Unspecified osteoarthritis, unspecified site: Secondary | ICD-10-CM

## 2020-08-13 DIAGNOSIS — Z9981 Dependence on supplemental oxygen: Secondary | ICD-10-CM

## 2020-08-13 DIAGNOSIS — I10 Essential (primary) hypertension: Secondary | ICD-10-CM

## 2020-08-13 DIAGNOSIS — E782 Mixed hyperlipidemia: Secondary | ICD-10-CM

## 2020-08-13 DIAGNOSIS — R972 Elevated prostate specific antigen [PSA]: Secondary | ICD-10-CM

## 2020-08-13 DIAGNOSIS — I5032 Chronic diastolic (congestive) heart failure: Secondary | ICD-10-CM

## 2020-08-13 DIAGNOSIS — M48061 Spinal stenosis, lumbar region without neurogenic claudication: Secondary | ICD-10-CM

## 2020-08-13 DIAGNOSIS — I25118 Atherosclerotic heart disease of native coronary artery with other forms of angina pectoris: Secondary | ICD-10-CM

## 2020-08-13 DIAGNOSIS — R0902 Hypoxemia: Secondary | ICD-10-CM

## 2020-08-13 DIAGNOSIS — G4733 Obstructive sleep apnea (adult) (pediatric): Secondary | ICD-10-CM

## 2020-08-13 DIAGNOSIS — J449 Chronic obstructive pulmonary disease, unspecified: Secondary | ICD-10-CM

## 2020-08-13 DIAGNOSIS — I208 Other forms of angina pectoris: Secondary | ICD-10-CM

## 2020-08-13 DIAGNOSIS — M5106 Intervertebral disc disorders with myelopathy, lumbar region: Secondary | ICD-10-CM

## 2020-08-13 DIAGNOSIS — I272 Pulmonary hypertension, unspecified: Secondary | ICD-10-CM

## 2020-08-13 DIAGNOSIS — C44310 Basal cell carcinoma of skin of unspecified parts of face: Secondary | ICD-10-CM

## 2020-08-13 DIAGNOSIS — Z7901 Long term (current) use of anticoagulants: Secondary | ICD-10-CM

## 2020-08-13 DIAGNOSIS — E785 Hyperlipidemia, unspecified: Secondary | ICD-10-CM

## 2020-08-13 DIAGNOSIS — I519 Heart disease, unspecified: Secondary | ICD-10-CM

## 2020-08-13 DIAGNOSIS — E119 Type 2 diabetes mellitus without complications: Secondary | ICD-10-CM

## 2020-08-13 DIAGNOSIS — I2510 Atherosclerotic heart disease of native coronary artery without angina pectoris: Secondary | ICD-10-CM

## 2020-08-13 DIAGNOSIS — M545 Low back pain: Secondary | ICD-10-CM

## 2020-08-13 DIAGNOSIS — Z8551 Personal history of malignant neoplasm of bladder: Secondary | ICD-10-CM

## 2020-08-13 DIAGNOSIS — E1159 Type 2 diabetes mellitus with other circulatory complications: Secondary | ICD-10-CM

## 2020-08-13 DIAGNOSIS — I482 Chronic atrial fibrillation, unspecified: Secondary | ICD-10-CM

## 2020-08-13 MED ORDER — ATORVASTATIN 80 MG PO TAB
80 mg | ORAL_TABLET | Freq: Every day | ORAL | 3 refills | Status: AC
Start: 2020-08-13 — End: ?

## 2020-08-13 MED ORDER — METOPROLOL SUCCINATE 50 MG PO TB24
50 mg | ORAL_TABLET | Freq: Two times a day (BID) | ORAL | 3 refills | 90.00000 days | Status: AC
Start: 2020-08-13 — End: ?

## 2020-08-13 MED ORDER — CLOPIDOGREL 75 MG PO TAB
75 mg | ORAL_TABLET | Freq: Every day | ORAL | 3 refills | 90.00000 days | Status: AC
Start: 2020-08-13 — End: ?

## 2020-08-13 NOTE — Patient Instructions
It was nice to see you in clinic today.    We discussed:   Your history of coronary artery disease, permanent atrial fibrillation, blood pressure, cholesterol, diabetes, and volume status   You seem to be doing quite well overall, and at your current clinical baseline   You did describe some occasional episodes of feeling rather sluggish and perhaps even a little bit lightheaded   Your wife is going to help take your blood pressure and heart rate a little more frequently for monitoring reasons.  Certainly it would be good to check heart rate and blood pressure during the episodes of feeling sluggish and lightheaded as well.  There could be a number of potential causes for this symptom.    I would like to make the following medication adjustments:   Please increase your dose of atorvastatin to 80 mg nightly.  You may take 2 of the 40 mg tablets until you have used up your current supply.    Otherwise continue the same medications as you have been doing.    No new testing at this time.    I will plan to see you back in about 6 months.  Please call us in the meantime with any questions or concerns.    The Cardiovascular Medicine "Green Team" nurse Marylyn Ishihara Minerva Ends McCloud, or Lavonna Rua) number is [phone].  If you have not heard the results of your testing in more than 1 week after it has been performed, please give [name] a call so that we may investigate further.  If you need to schedule an appointment, the "green team" scheduling number is [phone].

## 2020-10-18 ENCOUNTER — Encounter: Admit: 2020-10-18 | Discharge: 2020-10-18 | Payer: MEDICARE

## 2020-10-18 DIAGNOSIS — I25118 Atherosclerotic heart disease of native coronary artery with other forms of angina pectoris: Secondary | ICD-10-CM

## 2020-10-18 MED ORDER — ISOSORBIDE MONONITRATE 30 MG PO TB24
30 mg | ORAL_TABLET | Freq: Every morning | ORAL | 3 refills | 90.00000 days | Status: AC
Start: 2020-10-18 — End: ?

## 2021-03-11 ENCOUNTER — Encounter: Admit: 2021-03-11 | Discharge: 2021-03-11 | Payer: MEDICARE

## 2021-03-11 NOTE — Progress Notes
Records Request    Medical records request for continuation of care:    Patient has appointment  with  Dr. Erling Cruz .    Please fax records to [place] [facility] [place]    Request records:        Most Recent Labs        Thank you,      [facility]  [address] [place] [address]  Phone:  [phone]  Fax:  [phone]

## 2021-03-15 ENCOUNTER — Encounter: Admit: 2021-03-15 | Discharge: 2021-03-15 | Payer: MEDICARE

## 2021-03-21 ENCOUNTER — Encounter: Admit: 2021-03-21 | Discharge: 2021-03-21 | Payer: MEDICARE

## 2021-03-21 DIAGNOSIS — J449 Chronic obstructive pulmonary disease, unspecified: Secondary | ICD-10-CM

## 2021-03-21 DIAGNOSIS — I482 Chronic atrial fibrillation, unspecified: Secondary | ICD-10-CM

## 2021-03-21 DIAGNOSIS — I10 Essential (primary) hypertension: Secondary | ICD-10-CM

## 2021-03-21 DIAGNOSIS — E669 Obesity, unspecified: Secondary | ICD-10-CM

## 2021-03-21 DIAGNOSIS — M545 Low back pain: Secondary | ICD-10-CM

## 2021-03-21 DIAGNOSIS — E119 Type 2 diabetes mellitus without complications: Secondary | ICD-10-CM

## 2021-03-21 DIAGNOSIS — Z9981 Dependence on supplemental oxygen: Secondary | ICD-10-CM

## 2021-03-21 DIAGNOSIS — I2510 Atherosclerotic heart disease of native coronary artery without angina pectoris: Secondary | ICD-10-CM

## 2021-03-21 DIAGNOSIS — I4891 Unspecified atrial fibrillation: Secondary | ICD-10-CM

## 2021-03-21 DIAGNOSIS — K219 Gastro-esophageal reflux disease without esophagitis: Secondary | ICD-10-CM

## 2021-03-21 DIAGNOSIS — Z8551 Personal history of malignant neoplasm of bladder: Secondary | ICD-10-CM

## 2021-03-21 DIAGNOSIS — I25118 Atherosclerotic heart disease of native coronary artery with other forms of angina pectoris: Secondary | ICD-10-CM

## 2021-03-21 DIAGNOSIS — E785 Hyperlipidemia, unspecified: Secondary | ICD-10-CM

## 2021-03-21 DIAGNOSIS — I519 Heart disease, unspecified: Secondary | ICD-10-CM

## 2021-03-21 DIAGNOSIS — R0902 Hypoxemia: Secondary | ICD-10-CM

## 2021-03-21 DIAGNOSIS — M5106 Intervertebral disc disorders with myelopathy, lumbar region: Secondary | ICD-10-CM

## 2021-03-21 DIAGNOSIS — M48061 Spinal stenosis, lumbar region without neurogenic claudication: Secondary | ICD-10-CM

## 2021-03-21 DIAGNOSIS — I200 Unstable angina: Secondary | ICD-10-CM

## 2021-03-21 DIAGNOSIS — R0989 Other specified symptoms and signs involving the circulatory and respiratory systems: Secondary | ICD-10-CM

## 2021-03-21 DIAGNOSIS — R972 Elevated prostate specific antigen [PSA]: Secondary | ICD-10-CM

## 2021-03-21 DIAGNOSIS — Z7901 Long term (current) use of anticoagulants: Secondary | ICD-10-CM

## 2021-03-21 DIAGNOSIS — M1990 Unspecified osteoarthritis, unspecified site: Secondary | ICD-10-CM

## 2021-03-21 DIAGNOSIS — M4307 Spondylolysis, lumbosacral region: Secondary | ICD-10-CM

## 2021-03-21 DIAGNOSIS — I272 Pulmonary hypertension, unspecified: Secondary | ICD-10-CM

## 2021-03-21 DIAGNOSIS — G4733 Obstructive sleep apnea (adult) (pediatric): Secondary | ICD-10-CM

## 2021-03-21 DIAGNOSIS — E782 Mixed hyperlipidemia: Secondary | ICD-10-CM

## 2021-03-21 DIAGNOSIS — C44310 Basal cell carcinoma of skin of unspecified parts of face: Secondary | ICD-10-CM

## 2021-03-21 MED ORDER — SPIRONOLACTONE 25 MG PO TAB
25 mg | ORAL_TABLET | Freq: Every day | ORAL | 3 refills | 90.00000 days | Status: AC
Start: 2021-03-21 — End: ?

## 2021-03-21 NOTE — Patient Instructions
Follow up with Dr. Erling Cruz in 6 months with echocardiogram

## 2021-08-30 ENCOUNTER — Encounter: Admit: 2021-08-30 | Discharge: 2021-08-30 | Payer: MEDICARE

## 2021-08-30 DIAGNOSIS — I519 Heart disease, unspecified: Secondary | ICD-10-CM

## 2021-08-30 DIAGNOSIS — I25118 Atherosclerotic heart disease of native coronary artery with other forms of angina pectoris: Secondary | ICD-10-CM

## 2021-08-30 DIAGNOSIS — I482 Chronic atrial fibrillation, unspecified: Secondary | ICD-10-CM

## 2021-08-30 DIAGNOSIS — I208 Other forms of angina pectoris: Secondary | ICD-10-CM

## 2021-08-30 MED ORDER — METOPROLOL SUCCINATE 50 MG PO TB24
50 mg | ORAL_TABLET | Freq: Two times a day (BID) | ORAL | 3 refills | 90.00000 days | Status: AC
Start: 2021-08-30 — End: ?

## 2021-09-26 ENCOUNTER — Ambulatory Visit: Admit: 2021-09-26 | Discharge: 2021-09-26 | Payer: MEDICARE

## 2021-09-26 ENCOUNTER — Encounter: Admit: 2021-09-26 | Discharge: 2021-09-26 | Payer: MEDICARE

## 2021-09-26 DIAGNOSIS — I10 Essential (primary) hypertension: Secondary | ICD-10-CM

## 2021-09-26 DIAGNOSIS — I4891 Unspecified atrial fibrillation: Secondary | ICD-10-CM

## 2021-09-26 DIAGNOSIS — E782 Mixed hyperlipidemia: Secondary | ICD-10-CM

## 2021-09-26 DIAGNOSIS — R0902 Hypoxemia: Secondary | ICD-10-CM

## 2021-09-26 DIAGNOSIS — E785 Hyperlipidemia, unspecified: Secondary | ICD-10-CM

## 2021-09-26 DIAGNOSIS — I272 Pulmonary hypertension, unspecified: Secondary | ICD-10-CM

## 2021-09-26 DIAGNOSIS — E119 Type 2 diabetes mellitus without complications: Secondary | ICD-10-CM

## 2021-09-26 DIAGNOSIS — G4733 Obstructive sleep apnea (adult) (pediatric): Secondary | ICD-10-CM

## 2021-09-26 DIAGNOSIS — R972 Elevated prostate specific antigen [PSA]: Secondary | ICD-10-CM

## 2021-09-26 DIAGNOSIS — I25118 Atherosclerotic heart disease of native coronary artery with other forms of angina pectoris: Secondary | ICD-10-CM

## 2021-09-26 DIAGNOSIS — K219 Gastro-esophageal reflux disease without esophagitis: Secondary | ICD-10-CM

## 2021-09-26 DIAGNOSIS — C44310 Basal cell carcinoma of skin of unspecified parts of face: Secondary | ICD-10-CM

## 2021-09-26 DIAGNOSIS — M1990 Unspecified osteoarthritis, unspecified site: Secondary | ICD-10-CM

## 2021-09-26 DIAGNOSIS — J449 Chronic obstructive pulmonary disease, unspecified: Secondary | ICD-10-CM

## 2021-09-26 DIAGNOSIS — M4307 Spondylolysis, lumbosacral region: Secondary | ICD-10-CM

## 2021-09-26 DIAGNOSIS — M5106 Intervertebral disc disorders with myelopathy, lumbar region: Secondary | ICD-10-CM

## 2021-09-26 DIAGNOSIS — I200 Unstable angina: Secondary | ICD-10-CM

## 2021-09-26 DIAGNOSIS — M48061 Spinal stenosis, lumbar region without neurogenic claudication: Secondary | ICD-10-CM

## 2021-09-26 DIAGNOSIS — Z8551 Personal history of malignant neoplasm of bladder: Secondary | ICD-10-CM

## 2021-09-26 DIAGNOSIS — Z9981 Dependence on supplemental oxygen: Secondary | ICD-10-CM

## 2021-09-26 DIAGNOSIS — E669 Obesity, unspecified: Secondary | ICD-10-CM

## 2021-09-26 DIAGNOSIS — I2510 Atherosclerotic heart disease of native coronary artery without angina pectoris: Secondary | ICD-10-CM

## 2021-09-26 DIAGNOSIS — I482 Chronic atrial fibrillation, unspecified: Secondary | ICD-10-CM

## 2021-09-26 DIAGNOSIS — I519 Heart disease, unspecified: Secondary | ICD-10-CM

## 2021-09-26 DIAGNOSIS — Z7901 Long term (current) use of anticoagulants: Secondary | ICD-10-CM

## 2021-09-26 DIAGNOSIS — M545 Low back pain: Secondary | ICD-10-CM

## 2021-09-26 DIAGNOSIS — R0989 Other specified symptoms and signs involving the circulatory and respiratory systems: Secondary | ICD-10-CM

## 2021-09-26 MED ORDER — PERFLUTREN LIPID MICROSPHERES 1.1 MG/ML IV SUSP
1-10 mL | Freq: Once | INTRAVENOUS | 0 refills | Status: CP | PRN
Start: 2021-09-26 — End: ?

## 2021-09-26 NOTE — Patient Instructions
Follow up in 6 months  Have a fasting lipid profile drawn prior to office visit.    Follow up as directed.  Call sooner if issues.  Call the [place] line at [phone].  Leave a detailed message for the nurse in [place] with how we can assist you and we will call you back.

## 2021-09-30 ENCOUNTER — Encounter: Admit: 2021-09-30 | Discharge: 2021-09-30 | Payer: MEDICARE

## 2021-09-30 NOTE — Telephone Encounter
-----   Message from Chriss Driver, RN sent at 09/29/2021  2:53 PM CDT -----    ----- Message -----  From: Benna Dunks, MD  Sent: 09/29/2021   2:04 PM CDT  To: [facility] Nurse Gen Card Team [place]    Please let Mr. Gates know that his echocardiogram looked okay.  Normal heart pump function.  There does appear to be increased central venous pressure which means he may have a little extra fluid on board.  He looked good when I saw him in clinic.  Have him let us know if he is having any worsening swelling or shortness of breath.  Otherwise no changes.  Thank you

## 2021-10-10 ENCOUNTER — Encounter: Admit: 2021-10-10 | Discharge: 2021-10-10 | Payer: MEDICARE

## 2021-10-10 DIAGNOSIS — E782 Mixed hyperlipidemia: Secondary | ICD-10-CM

## 2021-10-10 DIAGNOSIS — I25118 Atherosclerotic heart disease of native coronary artery with other forms of angina pectoris: Secondary | ICD-10-CM

## 2021-10-10 MED ORDER — ISOSORBIDE MONONITRATE 30 MG PO TB24
30 mg | ORAL_TABLET | Freq: Every morning | ORAL | 3 refills | 90.00000 days | Status: AC
Start: 2021-10-10 — End: ?

## 2021-10-10 MED ORDER — ATORVASTATIN 80 MG PO TAB
80 mg | ORAL_TABLET | Freq: Every day | ORAL | 3 refills | Status: AC
Start: 2021-10-10 — End: ?

## 2021-10-10 MED ORDER — CLOPIDOGREL 75 MG PO TAB
75 mg | ORAL_TABLET | Freq: Every day | ORAL | 3 refills | 90.00000 days | Status: AC
Start: 2021-10-10 — End: ?

## 2021-12-13 ENCOUNTER — Encounter: Admit: 2021-12-13 | Discharge: 2021-12-13 | Payer: MEDICARE

## 2021-12-16 ENCOUNTER — Encounter: Admit: 2021-12-16 | Discharge: 2021-12-16 | Payer: MEDICARE

## 2021-12-21 ENCOUNTER — Encounter: Admit: 2021-12-21 | Discharge: 2021-12-21 | Payer: MEDICARE

## 2021-12-22 ENCOUNTER — Encounter: Admit: 2021-12-22 | Discharge: 2021-12-22 | Payer: MEDICARE

## 2021-12-22 NOTE — Telephone Encounter
-----   Message from Altamease Oiler, MD sent at 12/21/2021  5:12 PM CDT -----  Regarding: RE: Pt requesting your recommendations  It is a little hard to tell from the vascular surgery note how urgent this procedure is.  If it is urgent to get the runoff done to save his toe I think we should proceed without further testing.  He does have rather profound coronary artery disease but to our knowledge everything that could be fixed has been.  If it is going to be sometime before they do the aortogram with runoff then we could get a stress test.    Let me know about timing.    Thank you  ----- Message -----  From: Rogelia Boga, RN  Sent: 12/16/2021  10:33 AM CDT  To: Altamease Oiler, MD  Subject: Pt requesting your recommendations               [place] sent in the following mychart message:  When [name] had his heart procedure he was told the arteries around his heart were brittle and that was why the doctor didn't try any further stents.  The brittle artery issue has him concerned about having this procedure done on his leg for blood circulation.  He has a bad back & remembers the discomfort of laying for so many hours.  He just wanted his heart doctors' input before making a decision.     With all of his other health issues I hate seeing him worrying about this    Pt saw vascular surgery at [facility].  From their office visit notes:  Dr. Georgiann Mohs evaluated the patient along with myself and reviewed his ultrasound findings. She recommends an abdominal aortogram with runoff, possible intervention to see if we could help improve any perfusion to this ulcer to get it to heal. This was explained to the patient in detail. He would like to take a couple of days to think about this and will call us back with his decision. If we do not hear from him by next week our office will touch base with him. He voices understanding and agreeable with that.      Please let me know if you have any recommendations and I will follow up with the patient.    Thank you!  Asher Muir

## 2021-12-22 NOTE — Telephone Encounter
Called pt with Dr. Tressia Danas recommendations.  He has questions about his coronary arteries and previous blockages and stents and treating blockages in heart vs. legs.  He is also wondering if a stress test found in abnormalities, could he have anymore stents as he has been told his arteries are brittle.  Offered pt an appt with [facility] to discuss questions.

## 2021-12-23 ENCOUNTER — Encounter: Admit: 2021-12-23 | Discharge: 2021-12-23 | Payer: MEDICARE

## 2021-12-23 DIAGNOSIS — I25118 Atherosclerotic heart disease of native coronary artery with other forms of angina pectoris: Secondary | ICD-10-CM

## 2021-12-23 DIAGNOSIS — E782 Mixed hyperlipidemia: Secondary | ICD-10-CM

## 2021-12-23 DIAGNOSIS — I10 Essential (primary) hypertension: Secondary | ICD-10-CM

## 2021-12-23 NOTE — Progress Notes
Records Request  Regino Fournet   [birth year]    Medical records request for continuation of care:    Patient has appointment  with  Dr. Dinah Beers .    Please fax records to [place] [facility] [place]      Recent Cardiac Testing - Please cloud images for echocardiogram, cardiac stress test      Thank you,      Cardiovascular Medicine  [facility]  [address] [place] [address]  Phone:  [phone]  Fax:  [phone]

## 2021-12-28 ENCOUNTER — Encounter: Admit: 2021-12-28 | Discharge: 2021-12-28 | Payer: MEDICARE

## 2021-12-28 DIAGNOSIS — J449 Chronic obstructive pulmonary disease, unspecified: Secondary | ICD-10-CM

## 2021-12-28 DIAGNOSIS — I5032 Chronic diastolic (congestive) heart failure: Secondary | ICD-10-CM

## 2021-12-28 DIAGNOSIS — I4891 Unspecified atrial fibrillation: Secondary | ICD-10-CM

## 2021-12-28 DIAGNOSIS — E782 Mixed hyperlipidemia: Secondary | ICD-10-CM

## 2021-12-28 DIAGNOSIS — E119 Type 2 diabetes mellitus without complications: Secondary | ICD-10-CM

## 2021-12-28 DIAGNOSIS — E669 Obesity, unspecified: Secondary | ICD-10-CM

## 2021-12-28 DIAGNOSIS — K219 Gastro-esophageal reflux disease without esophagitis: Secondary | ICD-10-CM

## 2021-12-28 DIAGNOSIS — Z8551 Personal history of malignant neoplasm of bladder: Secondary | ICD-10-CM

## 2021-12-28 DIAGNOSIS — Z9981 Dependence on supplemental oxygen: Secondary | ICD-10-CM

## 2021-12-28 DIAGNOSIS — Z7901 Long term (current) use of anticoagulants: Secondary | ICD-10-CM

## 2021-12-28 DIAGNOSIS — I25118 Atherosclerotic heart disease of native coronary artery with other forms of angina pectoris: Secondary | ICD-10-CM

## 2021-12-28 DIAGNOSIS — M4307 Spondylolysis, lumbosacral region: Secondary | ICD-10-CM

## 2021-12-28 DIAGNOSIS — I200 Unstable angina: Secondary | ICD-10-CM

## 2021-12-28 DIAGNOSIS — I10 Essential (primary) hypertension: Secondary | ICD-10-CM

## 2021-12-28 DIAGNOSIS — M1990 Unspecified osteoarthritis, unspecified site: Secondary | ICD-10-CM

## 2021-12-28 DIAGNOSIS — E785 Hyperlipidemia, unspecified: Secondary | ICD-10-CM

## 2021-12-28 DIAGNOSIS — M5106 Intervertebral disc disorders with myelopathy, lumbar region: Secondary | ICD-10-CM

## 2021-12-28 DIAGNOSIS — G4733 Obstructive sleep apnea (adult) (pediatric): Secondary | ICD-10-CM

## 2021-12-28 DIAGNOSIS — I272 Pulmonary hypertension, unspecified: Secondary | ICD-10-CM

## 2021-12-28 DIAGNOSIS — R972 Elevated prostate specific antigen [PSA]: Secondary | ICD-10-CM

## 2021-12-28 DIAGNOSIS — I70223 Atherosclerosis of native arteries of extremities with rest pain, bilateral legs: Secondary | ICD-10-CM

## 2021-12-28 DIAGNOSIS — I739 Peripheral vascular disease, unspecified: Secondary | ICD-10-CM

## 2021-12-28 DIAGNOSIS — M48061 Spinal stenosis, lumbar region without neurogenic claudication: Secondary | ICD-10-CM

## 2021-12-28 DIAGNOSIS — L98499 Non-pressure chronic ulcer of skin of other sites with unspecified severity: Secondary | ICD-10-CM

## 2021-12-28 DIAGNOSIS — M545 Low back pain: Secondary | ICD-10-CM

## 2021-12-28 DIAGNOSIS — C44310 Basal cell carcinoma of skin of unspecified parts of face: Secondary | ICD-10-CM

## 2021-12-28 DIAGNOSIS — R0902 Hypoxemia: Secondary | ICD-10-CM

## 2021-12-28 DIAGNOSIS — I2510 Atherosclerotic heart disease of native coronary artery without angina pectoris: Secondary | ICD-10-CM

## 2021-12-28 NOTE — Progress Notes
CARDIOLOGY CONSULT PROGRESS NOTE    [place]             Admission Date: (Not on file)    Today's Date: 12/28/21  0         Events  Hemodynamic parameters: Normal blood pressure and heart rate noted in our office today.    Subjective:  Continues to be on 24 X7 oxygen therapy  No worsening in shortness of breath from his baseline level  No anginal symptoms  Not requiring any nitroglycerin pills  Lower extremity PAD noted: Gets his wound care done at [facility], [place]  He has seen vascular surgery from the [facility] system and they have recommended endovascular intervention for his lower extremity PAD  Nonhealing ulcer in his left foot              Assessment & Recs     1.  Coronary artery disease with previous PCI of the RCA with chronic stable angina  2.  Benign essential hypertension  3.  Mixed hyperlipidemia  4.  Longstanding persistent atrial fibrillation  5.  Congestive heart failure, chronic diastolic, NYHA class IVa, ACC AHA stage C  6.  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  Established history of CAD with a severely calcified right coronary artery with multiple tandem lesions.  He underwent PCI of the right coronary artery of the proximal segment with a technically very challenging intervention with Dr. Micheline Rough.  Downstream disease of tandem 80% stenosis is currently being managed medically.  His LVEF is preserved.  Most recently, his echocardiogram showed normal biventricular function and no major wall motion abnormalities.  Additionally, he is not requiring any nitroglycerin pills.  Well-controlled symptoms on long-acting antianginal therapy.  Heart failure seems to be well optimized and his medical therapy is at very good doses.  Well-controlled LDL levels on high-dose Lipitor. Has a longstanding left foot ulcer.  He sees wound care therapy at [place], [place].  Vascular surgery from the [facility] group have advised him endovascular intervention.  He has requested and has mentioned that he has a strong preference to get any form of procedures done at [facility]     Recommendations    1.  Without significant angina or clinical heart failure, there is no indication for revascularizing the remainder of his right coronary artery lesions especially with him having a preserved LVEF and no further deterioration in his heart failure symptoms.  His ongoing oxygen therapy needs and also class IVa symptoms are likely secondary to COPD given his extensive smoking history as opposed to heart failure per se  2.  Well optimized medication therapeutic regimen.  No change in heart failure regimen  3.  Recent echocardiogram reviewed: Normal biventricular function and valvular function.  4.  No bleeding complications on Plavix plus rivaroxaban given elevated CHA2DS2-VASc score.  No TIA or stroke symptoms noted.  Continue current therapy without any change  5.  Clinically appears euvolemic and hence no change in his diuretic dosing is recommended.  Well-controlled on Bumex and Aldactone combination.  6.  We will obtain all his records from [place], [place] in regards to ABI/CTA/bilateral lower extremity duplex.  He clearly has a nonhealing ulcer which places him at a higher Rutherford class.  Without addressing his arterial insufficiency, I anticipate  his wounds may progress.  He has done remarkably well with wound therapy.  We certainly need more information in regards to noninvasive assessment of any arterial insufficiency and at which level there is significant insufficiency (aortoiliac versus supra popliteal versus infrapopliteal).  I will also obtain these records and refer him to Dr. [name] our vascular intervention specialist for further management of his PAD.  7.  A-fib rate controlled and no symptoms noted.  8.  LDL is well controlled less than 55 mg/dL on high-dose Lipitor without symptoms of statin induced myalgias.  No change recommended.  9.  Angiographic films reviewed-plan as described above.  If he does have recurrence in angina or worsening heart failure, we could consider revascularization of the RCA.      [name], MD, [facility], [facility]  Interventional Cardiologist           Medications    Current Outpatient Medications:   ?  albuterol 0.5% (PROVENTIL; VENTOLIN) 2.5 mg/0.5 mL nebulizer solution, Inhale one each solution by nebulizer as directed four times daily as needed for Shortness of Breath or Wheezing., Disp: , Rfl:   ?  ALPRAZolam (XANAX) 0.25 mg tablet, Take one tablet by mouth daily as needed for Anxiety., Disp: , Rfl:   ?  atorvastatin (LIPITOR) 80 mg tablet, Take one tablet by mouth daily., Disp: 90 tablet, Rfl: 3  ?  bumetanide (BUMEX) 2 mg tablet, Take one tablet by mouth twice daily., Disp: , Rfl:   ?  canagliflozin (INVOKANA) 300 mg tablet, Take one tablet by mouth every 24 hours., Disp: , Rfl:   ?  clopiDOGreL (PLAVIX) 75 mg tablet, Take one tablet by mouth daily., Disp: 90 tablet, Rfl: 3  ?  cyanocobalamin (vitamin B-12) 500 mcg tablet, Take two tablets by mouth daily., Disp: , Rfl:   ?  ELDERBERRY FRUIT PO, Take 1 tablet by mouth daily., Disp: , Rfl:   ?  HYDROcodone/acetaminophen (NORCO) 5/325 mg tablet, Take one tablet by mouth as Needed for Pain., Disp: , Rfl:   ?  hydrocortisone (HYTONE) 2.5 % topical cream, APPLY TOPICALLY TO THE FACE TWICE DAILY FOR 1 TO 2 WEEKS AS NEEDED FLARES MIX IN THE HAND WITHH KETOCONAZOLE CREAM, Disp: , Rfl:   ?  insulin aspart 70/30 (+) (NOVOLOG MIX 70/30) 100 unit/mL (70-30), Inject eighty Units under the skin three times daily., Disp: , Rfl:   ?  isosorbide mononitrate ER (IMDUR) 30 mg tablet,extended release 24 hr, Take one tablet by mouth every morning., Disp: 90 tablet, Rfl: 3  ?  MAGNESIUM PO, Take 500 mg by mouth at bedtime daily., Disp: , Rfl:   ?  metFORMIN (GLUCOPHAGE) 500 mg tablet, Take one tablet by mouth twice daily with meals. Hold 48 hours after cath procedure.  Resume with evening dose on 11/09/17, Disp: 180 tablet, Rfl: 3  ?  metoprolol succinate XL (TOPROL XL) 50 mg extended release tablet, Take one tablet by mouth twice daily., Disp: 180 tablet, Rfl: 3  ?  montelukast (SINGULAIR) 10 mg tablet, Take one tablet by mouth at bedtime daily., Disp: , Rfl:   ?  MULTIVITAMIN PO, Take 1 tablet by mouth daily., Disp: , Rfl:   ?  nitroglycerin (NITROSTAT) 0.4 mg tablet, Place one tablet under tongue every 5 minutes as needed for Chest Pain. Max of 3 tablets, call 911., Disp: 25 tablet, Rfl: 3  ?  potassium chloride SR (K-DUR) 20 mEq tablet, Take one tablet by mouth three times daily. Take with a  meal and a full glass of water., Disp: , Rfl:   ?  rivaroxaban (XARELTO) 20 mg tablet, Take one tablet by mouth daily. Take with food., Disp: , Rfl:   ?  spironolactone (ALDACTONE) 25 mg tablet, Take one tablet by mouth daily. Take with food. (Patient taking differently: Take one tablet by mouth twice daily. Take with food.), Disp: 90 tablet, Rfl: 3  ?  tiotropium bromide (SPIRIVA) 18 mcg capsule for inhaler, Place one capsule into inhaler and inhale into lungs as directed daily., Disp: , Rfl:     Review Of Systems  None besides facts mentioned above      Physical Exam                          Vital Signs: Most Recent   Vitals:    12/28/21 1420   BP: 114/72   BP Source: Arm, Left Upper   Pulse: 82   SpO2: 96%   O2 Percent: 96 %   O2 Device: Nasal cannula   O2 Liter Flow: 3 Lpm   PainSc: Zero   Weight: 124.6 kg (274 lb 9.6 oz)   Height: 167.6 cm (5' 6)          Body mass index is 44.32 kg/m?Marland Kitchen    General Appearance: moderately overweight, no distress   Skin: warm, no ulcers or xanthomas; few ecchymoses   Eyes: conjunctivae and lids normal, pupils are equal and round   Neck Veins: normal JVP , neck veins are not distended   Thyroid: no nodules, masses, tenderness or enlargement   Cardiovascular system: Pulse 86/min, regular rhythm , normal volume, no specific character, felt equally in all peripheries and there was no radio femoral delay.  PMI undisplaced, no palpable thrills/heaves, S1 S2 heard, no murmurs, no rub, no jugular venous distension, no carotid bruit.  Peripheral Circulation: normal peripheral circulation   Pedal Pulses: normal symmetric pedal pulses   Carotid Arteries: normal carotid upstroke bilaterally, no bruits   Respiratory system: No acute distress  No use of accessory muscles  Normal vesicular breath sounds over all the lung fields bilaterally  No added sounds    Lab/Radiology/Other Diagnostic Tests:    Pertinent labs, prior echo results and angiogram films reviewed.

## 2021-12-28 NOTE — Patient Instructions
Thank you for visiting our office today.    Continue the same medications as you have been doing.          We will be pursuing the following tests after your appointment today:       Orders Placed This Encounter    Referral to Vascular          Please call us in the meantime with any questions or concerns.        Please allow 5-7 business days for our providers to review your results. All normal results will go to MyChart. If you do not have Mychart, it is strongly recommended to get this so you can easily view all your results. If you do not have mychart, we will attempt to call you once with normal lab and testing results. If we cannot reach you by phone with normal results, we will send you a letter.  If you have not heard the results of your testing after one week please give [name] a call.       Your Cardiovascular Medicine [place] Team Richardson Landry, Rene Kocher and [place])  phone number is [phone].

## 2022-01-02 ENCOUNTER — Encounter: Admit: 2022-01-02 | Discharge: 2022-01-02 | Payer: MEDICARE

## 2022-01-31 ENCOUNTER — Encounter: Admit: 2022-01-31 | Discharge: 2022-01-31 | Payer: MEDICARE

## 2022-02-25 ENCOUNTER — Encounter: Admit: 2022-02-25 | Discharge: 2022-02-25 | Payer: MEDICARE

## 2022-03-02 ENCOUNTER — Encounter: Admit: 2022-03-02 | Discharge: 2022-03-02 | Payer: MEDICARE

## 2022-03-02 DIAGNOSIS — C44310 Basal cell carcinoma of skin of unspecified parts of face: Secondary | ICD-10-CM

## 2022-03-02 DIAGNOSIS — K219 Gastro-esophageal reflux disease without esophagitis: Secondary | ICD-10-CM

## 2022-03-02 DIAGNOSIS — G4733 Obstructive sleep apnea (adult) (pediatric): Secondary | ICD-10-CM

## 2022-03-02 DIAGNOSIS — Z9981 Dependence on supplemental oxygen: Secondary | ICD-10-CM

## 2022-03-02 DIAGNOSIS — E785 Hyperlipidemia, unspecified: Secondary | ICD-10-CM

## 2022-03-02 DIAGNOSIS — Z7901 Long term (current) use of anticoagulants: Secondary | ICD-10-CM

## 2022-03-02 DIAGNOSIS — M4307 Spondylolysis, lumbosacral region: Secondary | ICD-10-CM

## 2022-03-02 DIAGNOSIS — Z8551 Personal history of malignant neoplasm of bladder: Secondary | ICD-10-CM

## 2022-03-02 DIAGNOSIS — I272 Pulmonary hypertension, unspecified: Secondary | ICD-10-CM

## 2022-03-02 DIAGNOSIS — J449 Chronic obstructive pulmonary disease, unspecified: Secondary | ICD-10-CM

## 2022-03-02 DIAGNOSIS — I10 Essential (primary) hypertension: Secondary | ICD-10-CM

## 2022-03-02 DIAGNOSIS — M545 Low back pain: Secondary | ICD-10-CM

## 2022-03-02 DIAGNOSIS — I2510 Atherosclerotic heart disease of native coronary artery without angina pectoris: Secondary | ICD-10-CM

## 2022-03-02 DIAGNOSIS — R0902 Hypoxemia: Secondary | ICD-10-CM

## 2022-03-02 DIAGNOSIS — R972 Elevated prostate specific antigen [PSA]: Secondary | ICD-10-CM

## 2022-03-02 DIAGNOSIS — I200 Unstable angina: Secondary | ICD-10-CM

## 2022-03-02 DIAGNOSIS — M48061 Spinal stenosis, lumbar region without neurogenic claudication: Secondary | ICD-10-CM

## 2022-03-02 DIAGNOSIS — I70245 Atherosclerosis of native arteries of left leg with ulceration of other part of foot: Secondary | ICD-10-CM

## 2022-03-02 DIAGNOSIS — M5106 Intervertebral disc disorders with myelopathy, lumbar region: Secondary | ICD-10-CM

## 2022-03-02 DIAGNOSIS — E669 Obesity, unspecified: Secondary | ICD-10-CM

## 2022-03-02 DIAGNOSIS — M1990 Unspecified osteoarthritis, unspecified site: Secondary | ICD-10-CM

## 2022-03-02 DIAGNOSIS — I4891 Unspecified atrial fibrillation: Secondary | ICD-10-CM

## 2022-03-02 DIAGNOSIS — E119 Type 2 diabetes mellitus without complications: Secondary | ICD-10-CM

## 2022-03-02 NOTE — Progress Notes
STAT    Request for the following medical records [place]  for purpose of continuity of care. Patient has an upcoming cardiology appointment.     Patient: [place]  DOB: 02-25-1941    Please Fax:    Provider notes from hospital stay May 15-18, 2023        Please fax to: [phone]  Cardiology Services at the [place]  Attention: Medical Record/Dr Lyndel Safe    Thank you

## 2022-03-07 ENCOUNTER — Ambulatory Visit: Admit: 2022-03-07 | Discharge: 2022-03-08 | Payer: MEDICARE

## 2022-03-07 ENCOUNTER — Encounter: Admit: 2022-03-07 | Discharge: 2022-03-07 | Payer: MEDICARE

## 2022-03-07 VITALS — BP 208/167 | HR 80 | Temp 97.90000°F | Ht 66.0 in | Wt 262.0 lb

## 2022-03-07 VITALS — BP 114/63 | HR 93

## 2022-03-07 DIAGNOSIS — I219 Acute myocardial infarction, unspecified: Secondary | ICD-10-CM

## 2022-03-07 DIAGNOSIS — Z9289 Personal history of other medical treatment: Secondary | ICD-10-CM

## 2022-03-07 DIAGNOSIS — Z8551 Personal history of malignant neoplasm of bladder: Secondary | ICD-10-CM

## 2022-03-07 DIAGNOSIS — E785 Hyperlipidemia, unspecified: Secondary | ICD-10-CM

## 2022-03-07 DIAGNOSIS — Z7901 Long term (current) use of anticoagulants: Secondary | ICD-10-CM

## 2022-03-07 DIAGNOSIS — I200 Unstable angina: Secondary | ICD-10-CM

## 2022-03-07 DIAGNOSIS — E669 Obesity, unspecified: Secondary | ICD-10-CM

## 2022-03-07 DIAGNOSIS — Z6841 Body Mass Index (BMI) 40.0 and over, adult: Secondary | ICD-10-CM

## 2022-03-07 DIAGNOSIS — Z9981 Dependence on supplemental oxygen: Secondary | ICD-10-CM

## 2022-03-07 DIAGNOSIS — F17210 Nicotine dependence, cigarettes, uncomplicated: Secondary | ICD-10-CM

## 2022-03-07 DIAGNOSIS — M1990 Unspecified osteoarthritis, unspecified site: Secondary | ICD-10-CM

## 2022-03-07 DIAGNOSIS — M4307 Spondylolysis, lumbosacral region: Secondary | ICD-10-CM

## 2022-03-07 DIAGNOSIS — I272 Pulmonary hypertension, unspecified: Secondary | ICD-10-CM

## 2022-03-07 DIAGNOSIS — R0902 Hypoxemia: Secondary | ICD-10-CM

## 2022-03-07 DIAGNOSIS — J449 Chronic obstructive pulmonary disease, unspecified: Secondary | ICD-10-CM

## 2022-03-07 DIAGNOSIS — C679 Malignant neoplasm of bladder, unspecified: Secondary | ICD-10-CM

## 2022-03-07 DIAGNOSIS — R972 Elevated prostate specific antigen [PSA]: Secondary | ICD-10-CM

## 2022-03-07 DIAGNOSIS — K219 Gastro-esophageal reflux disease without esophagitis: Secondary | ICD-10-CM

## 2022-03-07 DIAGNOSIS — C4490 Unspecified malignant neoplasm of skin, unspecified: Secondary | ICD-10-CM

## 2022-03-07 DIAGNOSIS — I70245 Atherosclerosis of native arteries of left leg with ulceration of other part of foot: Secondary | ICD-10-CM

## 2022-03-07 DIAGNOSIS — C44310 Basal cell carcinoma of skin of unspecified parts of face: Secondary | ICD-10-CM

## 2022-03-07 DIAGNOSIS — G4730 Sleep apnea, unspecified: Secondary | ICD-10-CM

## 2022-03-07 DIAGNOSIS — G4733 Obstructive sleep apnea (adult) (pediatric): Secondary | ICD-10-CM

## 2022-03-07 DIAGNOSIS — Z87891 Personal history of nicotine dependence: Secondary | ICD-10-CM

## 2022-03-07 DIAGNOSIS — E7800 Pure hypercholesterolemia, unspecified: Secondary | ICD-10-CM

## 2022-03-07 DIAGNOSIS — I10 Essential (primary) hypertension: Secondary | ICD-10-CM

## 2022-03-07 DIAGNOSIS — M545 Low back pain: Secondary | ICD-10-CM

## 2022-03-07 DIAGNOSIS — E119 Type 2 diabetes mellitus without complications: Secondary | ICD-10-CM

## 2022-03-07 DIAGNOSIS — I2510 Atherosclerotic heart disease of native coronary artery without angina pectoris: Secondary | ICD-10-CM

## 2022-03-07 DIAGNOSIS — M48061 Spinal stenosis, lumbar region without neurogenic claudication: Secondary | ICD-10-CM

## 2022-03-07 DIAGNOSIS — M5106 Intervertebral disc disorders with myelopathy, lumbar region: Secondary | ICD-10-CM

## 2022-03-07 DIAGNOSIS — I4891 Unspecified atrial fibrillation: Secondary | ICD-10-CM

## 2022-03-07 DIAGNOSIS — I739 Peripheral vascular disease, unspecified: Secondary | ICD-10-CM

## 2022-03-07 NOTE — Progress Notes
Date of Service: 03/07/2022    Ian Velez is a 81 y.o. male.       HPI     Ian [place] comes today for a vascular consultation.  He is here for a second opinion regarding his vascular disease and nonhealing wound in the left great toe.  He is accompanied by his wife and his daughter.  They live in [place].  He has been dealing with a wound in the left great toe which started last year in January and then opened up to a significant wound.  His wife has taken a detailed photographic journey of the wound.  They have been working with wound care in [place] and they all noted that the wound has continued to improve though is still not fully healed.  He was seen by vascular surgery in [place] and angiography was recommended.  Noninvasive studies showed no significant stenosis in the left femoral-popliteal vessels but no flow was noted in the posterior tibial and no flow was noted in the anterior tibial artery as well but biphasic flow noted in the peroneal as well as the dorsalis pedis.    The patient has a significant crush injury in 1977 to the left calf with significant deformity and he was told by the surgeon that one of the arteries in the calf had to be ligated.  The patient is hesitant to proceed with angiography because of his prior experience with coronary angiography and him being told that he had pretty difficult arteries to work with.    He has had 3 episodes of cellulitis involving his left shin at the site of crush injury.  An MRI done in May showed no osteomyelitis but showed extensive soft tissue swelling in the great toe extending towards the midfoot and hindfoot consistent with cellulitis there as well.  No DVT was noted.    Past medical history  1. Coronary artery disease with prior PCI with residual disease that is being medically managed by Dr. Sandria [place] and Dr. [place] at [place].   2. Chronic diastolic dysfunction with mildly reduced systolic function  3. 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 Insulin requiring diabetes mellitus  5. Obstructive sleep apnea on BiPAP  6. Hypertension  7. Dyslipidemia    Social history: Quit smoking in 2010.  Does not use alcohol in any significant quantities         Vitals:    03/07/22 1530 03/07/22 1531   BP: (!) 208/167 114/63   BP Source: Arm, Right Upper Arm, Left Upper   Pulse: 80 93   Temp: 36.6 ?C (97.9 ?F)    TempSrc: Oral    PainSc: Four    Weight: 118.8 kg (262 lb)    Height: 167.6 cm (5' 6)      Body mass index is 42.29 kg/m?Marland Kitchen     Past Medical History  Patient Active Problem List    Diagnosis Date Noted   ? 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     11/15/2021 MRI Left foot ([place]): Ulcer along the medial most aspect of the great toe distally is noted. There is extensive soft tissue swelling of the great toe extending towards the midfoot and hindfoot with post gadolinium enhancement consistent with cellulitis.    11/14/2021 US Duplex left lower extremity veins (  [place]): No evidence of dep venous thrombosis in the lower extremity veins as visualized.  10/31/2021 Bilateral lower ext arterial doppler US ([place]): 75% or greater stenosis involving the distal right SFA and/or proximal popliteal artery. Proximal trifurcation vessels on the right are not evaluated.      ? Diastolic dysfunction with chronic heart failure (HCC) 01/11/2018   ? Obesity, Class III, BMI 40-49.9 (morbid obesity) (HCC) 01/11/2018   ? Pulmonary hypertension (HCC)    ? Obstructive sleep apnea    ? OA (osteoarthritis)    ? Lumbosacral spondylolysis    ? Hypoxemia    ? Essential hypertension      Echo - 01/30/18 at [place] - 1. Qualitatively normal LV size and function, EF ~ 55%; 2. Unable to accurately assess diastolic function due to underlying atrial fibrillation; 3. Qualitatively the RV appears mildly dilated with mildly reduced systolic function 4. Qualitatively the RA appears mildly dilated; 5. Valvular structures were not well visualized.  No significant Doppler abnormalities; 6. Estimated peak systolic PA pressure =  45 mmHg; 7. No pericardial effusion     ? Mixed hyperlipidemia    ? History of bladder carcinoma    ? DM (diabetes mellitus), type 2 (HCC)    ? 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 Chronic atrial fibrillation (HCC)    ? Coronary artery disease of native artery of native heart with stable angina pectoris (HCC)      1. 11/07/2017 coronary angiogram  - Highly complex severe disease of the right coronary and circumflex with a complete occlusion of both the circumflex and right coronary artery with TIMI 1 flow distal in both arteries.Mild disease of the LAD and left main.Successful intervention of the right coronary, re-establishing normal flow.  There continues to be serial 90% stenoses in the distal right coronary artery, that are also heavily calcified.     ? Chronic GERD    ? On home oxygen therapy      3L     ? Chronic stable angina (HCC)    ? Chronic anticoagulation          Review of Systems   Constitutional: Positive for malaise/fatigue.   HENT: Positive for tinnitus.    Cardiovascular: Positive for leg swelling.   Respiratory: Positive for shortness of breath.    Musculoskeletal: Positive for arthritis, back pain and myalgias.   Neurological: Positive for numbness.   All other systems reviewed and are negative.      Physical Exam    General Appearance: No acute distress. Fully alert and oriented.  Skin: Warm. No ulcers or xanthomas.   HEENT: Grossly unremarkable. Lips and oral mucosa without pallor or cyanosis. Moist mucous membranes.   Neck Veins: Normal jugular venous pressure. Neck veins are not distended.  Carotid Arteries: Normal carotid upstroke bilaterally. No bruits.  Chest Inspection: Chest is normal in appearance.  Auscultation/Percussion: Normal respiratory effort. Lungs clear to auscultation bilaterally. No wheezes, rales, or rhonchi.    Cardiac Rhythm: Regular rhythm. Normal rate.  Cardiac Auscultation: Normal S1 & S2. No S3 or S4. No rub.  Murmurs: No cardiac murmurs.  Peripheral Circulation: Normal peripheral circulation.   Abdominal Aorta: No abdominal aortic bruit.  Extremities: Appropriately warm to touch.   Left great toe wound involving the lateral aspect of the toe with an open skin but no bone visible with partial healing.  There is evidence of mild to moderate chronic edema involving both lower extremities without  pitting and consistent with chronic longstanding edema  1+ dorsalis pedis in the right foot and perhaps a faint in the left.  PT is not palpable bilaterally  Abdominal Exam: Soft, non-tender. No masses, no organomegaly. Normal bowel sounds.  Neurologic Exam:  Significantly reduced tactile sensation in both feet  ?    Cardiovascular [place] Factors  Vitals BP Readings from Last 3 Encounters:   03/07/22 114/63   12/28/21 114/72   09/26/21 127/72     Wt Readings from Last 3 Encounters:   03/07/22 118.8 kg (262 lb)   12/28/21 124.6 kg (274 lb 9.6 oz)   09/26/21 124.6 kg (274 lb 9.6 oz)     BMI Readings from Last 3 Encounters:   03/07/22 42.29 kg/m?   12/28/21 44.32 kg/m?   09/26/21 44.32 kg/m?      Smoking Social History     Tobacco Use   Smoking Status Former   ? Packs/day: 2.00   ? Years: 50.00   ? Additional pack years: 0.00   ? Total pack years: 100.00   ? Types: Cigarettes   ? Quit date: 07/12/2008   ? Years since quitting: 13.6   Smokeless Tobacco Never      Lipid Profile Cholesterol   Date Value Ref Range Status   12/20/2021 100  Final     HDL   Date Value Ref Range Status   12/20/2021 41  Final     LDL   Date Value Ref Range Status   12/20/2021 43  Final     Triglycerides   Date Value Ref Range Status   12/20/2021 120  Final      Blood Sugar Hemoglobin A1C   Date Value Ref Range Status   06/22/2020 8.4 (H) <=5.6 Final     Glucose Date Value Ref Range Status   12/10/2020 126 (H) 74 - 106 Final   06/22/2020 202 (H) 74 - 106 Final   12/22/2019 114 (H) 74 - 106 Final     Glucose, POC   Date Value Ref Range Status   11/08/2017 153 (H) 70 - 100 MG/DL Final   38/75/6433 295 (H) 70 - 100 MG/DL Final   18/84/1660 630 (H) 70 - 100 MG/DL Final          Problems Addressed Today  No diagnosis found.    Assessment and Plan     Slowly and poorly healing wound in the left great toe: The patient and I had a good discussion about his foot wound and neck steps.  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  He has a surgical ligation of one of the left calf vessels in 1977 after trauma that was likely the posterior tibial.  His duplex ultrasound shows no femoral-popliteal disease and biphasic flow in the peroneal and dorsalis pedis but no flow in the posterior tibial and the more proximal anterior tibial.    The patient is hesitant in proceeding at this time.  We discussed that there was no urgency to proceed as long as the wound keeps healing objectively.  His wife takes really good care of the wound along with the wound care center in the [facility] [place] and documents my photographs.  Our indications to proceed with angiography with possible revascularizations would be nonhealing of the wound or worsening of the wound over the next 6 to 12 weeks or development of repeat episode of cellulitis  in the great toe.    The wife will let us know through MyChart and send [name] photographs as well.  They will keep in touch with me through our office in [facility] [place] and through [place].    I have recommended that if they have another episode of cellulitis to also get infectious diseases involved.  They can change their mind anytime and we will proceed with angiography which is not unreasonable at all but is not urgent considering their hesitation.    I will schedule a telehealth visit with them in 3 months to discuss further.    This note was dictated using the dragon speech recognition software.  Transcription errors may occur with the use of this software. Editing and proofreading were done by the author of this document.  In spite of the author's best effort to identify every error introduced by voice to text dictation, some errors that may represent misspelling or misstatements of what was dictated may persist.  If there are questions about content in this document please contact Dr. [name].           Current Medications (including today's revisions)  ? albuterol 0.5% (PROVENTIL; VENTOLIN) 2.5 mg/0.5 mL nebulizer solution Inhale one each solution by nebulizer as directed four times daily as needed for Shortness of Breath or Wheezing.   ? atorvastatin (LIPITOR) 80 mg tablet Take one tablet by mouth daily.   ? canagliflozin (INVOKANA) 300 mg tablet Take one tablet by mouth every 24 hours.   ? clopiDOGreL (PLAVIX) 75 mg tablet Take one tablet by mouth daily.   ? cyanocobalamin (vitamin B-12) 500 mcg tablet Take two tablets by mouth daily.   ? ELDERBERRY FRUIT PO Take 1 tablet by mouth daily.   ? HYDROcodone/acetaminophen (NORCO) 5/325 mg tablet Take one tablet by mouth as Needed for Pain.   ? hydrocortisone (HYTONE) 2.5 % topical cream APPLY TOPICALLY TO THE FACE TWICE DAILY FOR 1 TO 2 WEEKS AS NEEDED FLARES MIX IN THE HAND WITHH KETOCONAZOLE CREAM   ? insulin aspart 70/30 (+) (NOVOLOG MIX 70/30) 100 unit/mL (70-30) Inject eighty Units under the skin three times daily.   ? isosorbide mononitrate ER (IMDUR) 30 mg tablet,extended release 24 hr Take one tablet by mouth every morning.   ? MAGNESIUM PO Take 500 mg by mouth at bedtime daily.   ? metFORMIN (GLUCOPHAGE) 500 mg tablet Take one tablet by mouth twice daily with meals. Hold 48 hours after cath procedure.  Resume with evening dose on 11/09/17   ? metoprolol succinate XL (TOPROL XL) 50 mg extended release tablet Take one tablet by mouth twice daily.   ? montelukast (SINGULAIR) 10 mg tablet Take one tablet by mouth at bedtime daily.   ? MULTIVITAMIN PO Take 1 tablet by mouth daily.   ? nitroglycerin (NITROSTAT) 0.4 mg tablet Place one tablet under tongue every 5 minutes as needed for Chest Pain. Max of 3 tablets, call 911.   ? potassium chloride SR (K-DUR) 20 mEq tablet Take one tablet by mouth three times daily. Take with a meal and a full glass of water.   ? rivaroxaban (XARELTO) 20 mg tablet Take one tablet by mouth daily. Take with food.   ? spironolactone (ALDACTONE) 25 mg tablet Take one tablet by mouth daily. Take with food. (Patient taking differently: Take one tablet by mouth twice daily. Take with food.)   ? tiotropium bromide (SPIRIVA) 18 mcg capsule for inhaler Place one capsule into inhaler and inhale  into lungs as directed daily.   ? torsemide (DEMADEX) 20 mg tablet Take three tablets by mouth daily.

## 2022-03-07 NOTE — Patient Instructions
Follow-Up:    -Thank you for allowing [name] to participate in your care today. Your After Visit Summary is being completed by Gwinda Passe, RN.    -We would like you to follow up as needed.  -The schedule is released approximately 4-5 months in advance. You will be called by our scheduling department to make an appointment and you will also receive a notification via MyChart to self-schedule.  However, if you would like to call to make this appointment, please call [phone].    Contacting our office:    -For NON-URGENT questions please contact us via message through your MyChart account.   -For all medication refills please contact your pharmacy or send a request through MyChart.     -For all questions that may need to be addressed urgently please call the [facility] nursing triage line at [phone] Monday - Friday 8am-5pm only. Please leave a detailed message with your name, date of birth, and reason for your call.  If your message is received before 3:30pm, every effort will be made to call you back the same day.  Please allow time for [name] to review your chart prior to call back.     -Should you have an urgent concern over the weekend/nights, the on-call triage line is [phone].    [facility] nursing team fax number: [phone]    -You may receive a survey in the upcoming weeks from [facility]. Your feedback is important to [name] and helps [name] continue to improve patient care and patient satisfaction.     -Please feel free to call our Financial Department at [phone] with any questions or concerns about estimated cost of testing or imaging ordered today. We are happy to provide CPT codes upon request.    Results & Testing Follow Up:    -Please allow 5-7 business days for the results of any testing to be reviewed. Please call our office if you have not heard from a nurse within this time frame.    -Should you choose to complete testing at an outside facility, please contact our office after completion of testing so that we can ensure that we have received results for your provider to review.    Lab and test results:  As a part of the CARES act, starting 10/02/2019, some results will be released to you via MyChart immediately and automatically.  You may see results before your provider sees them; however, your provider will review all these results and then they, or one of their team, will notify you of result information and recommendations.   Critical results will be addressed immediately, but otherwise, please allow [name] time to get back with you prior to you reaching out to [name] for questions.  This will usually take about 72 hours for labs and 5-7 days for procedure test results.

## 2022-03-08 ENCOUNTER — Encounter: Admit: 2022-03-08 | Discharge: 2022-03-08 | Payer: MEDICARE

## 2022-03-08 DIAGNOSIS — I739 Peripheral vascular disease, unspecified: Secondary | ICD-10-CM

## 2022-03-08 DIAGNOSIS — G4730 Sleep apnea, unspecified: Secondary | ICD-10-CM

## 2022-03-08 DIAGNOSIS — M48061 Spinal stenosis, lumbar region without neurogenic claudication: Secondary | ICD-10-CM

## 2022-03-08 DIAGNOSIS — J984 Other disorders of lung: Secondary | ICD-10-CM

## 2022-03-08 DIAGNOSIS — I2510 Atherosclerotic heart disease of native coronary artery without angina pectoris: Secondary | ICD-10-CM

## 2022-03-08 DIAGNOSIS — I200 Unstable angina: Secondary | ICD-10-CM

## 2022-03-08 DIAGNOSIS — E669 Obesity, unspecified: Secondary | ICD-10-CM

## 2022-03-08 DIAGNOSIS — R0989 Other specified symptoms and signs involving the circulatory and respiratory systems: Secondary | ICD-10-CM

## 2022-03-08 DIAGNOSIS — C679 Malignant neoplasm of bladder, unspecified: Secondary | ICD-10-CM

## 2022-03-08 DIAGNOSIS — I219 Acute myocardial infarction, unspecified: Secondary | ICD-10-CM

## 2022-03-08 DIAGNOSIS — R972 Elevated prostate specific antigen [PSA]: Secondary | ICD-10-CM

## 2022-03-08 DIAGNOSIS — E7800 Pure hypercholesterolemia, unspecified: Secondary | ICD-10-CM

## 2022-03-08 DIAGNOSIS — Z9289 Personal history of other medical treatment: Secondary | ICD-10-CM

## 2022-03-08 DIAGNOSIS — K219 Gastro-esophageal reflux disease without esophagitis: Secondary | ICD-10-CM

## 2022-03-08 DIAGNOSIS — I70245 Atherosclerosis of native arteries of left leg with ulceration of other part of foot: Secondary | ICD-10-CM

## 2022-03-08 DIAGNOSIS — E119 Type 2 diabetes mellitus without complications: Secondary | ICD-10-CM

## 2022-03-08 DIAGNOSIS — Z7901 Long term (current) use of anticoagulants: Secondary | ICD-10-CM

## 2022-03-08 DIAGNOSIS — C4490 Unspecified malignant neoplasm of skin, unspecified: Secondary | ICD-10-CM

## 2022-03-08 DIAGNOSIS — I4891 Unspecified atrial fibrillation: Secondary | ICD-10-CM

## 2022-03-08 DIAGNOSIS — M1990 Unspecified osteoarthritis, unspecified site: Secondary | ICD-10-CM

## 2022-03-08 DIAGNOSIS — B999 Unspecified infectious disease: Secondary | ICD-10-CM

## 2022-03-08 DIAGNOSIS — I10 Essential (primary) hypertension: Secondary | ICD-10-CM

## 2022-03-08 DIAGNOSIS — E785 Hyperlipidemia, unspecified: Secondary | ICD-10-CM

## 2022-03-08 DIAGNOSIS — I519 Heart disease, unspecified: Secondary | ICD-10-CM

## 2022-03-08 DIAGNOSIS — C44310 Basal cell carcinoma of skin of unspecified parts of face: Secondary | ICD-10-CM

## 2022-03-08 DIAGNOSIS — Z9229 Personal history of other drug therapy: Secondary | ICD-10-CM

## 2022-03-08 DIAGNOSIS — E782 Mixed hyperlipidemia: Secondary | ICD-10-CM

## 2022-03-08 DIAGNOSIS — I25118 Atherosclerotic heart disease of native coronary artery with other forms of angina pectoris: Secondary | ICD-10-CM

## 2022-03-08 DIAGNOSIS — Z9981 Dependence on supplemental oxygen: Secondary | ICD-10-CM

## 2022-03-08 DIAGNOSIS — Z8551 Personal history of malignant neoplasm of bladder: Secondary | ICD-10-CM

## 2022-03-08 DIAGNOSIS — M4307 Spondylolysis, lumbosacral region: Secondary | ICD-10-CM

## 2022-03-08 DIAGNOSIS — G4733 Obstructive sleep apnea (adult) (pediatric): Secondary | ICD-10-CM

## 2022-03-08 DIAGNOSIS — Z87891 Personal history of nicotine dependence: Secondary | ICD-10-CM

## 2022-03-08 DIAGNOSIS — F17210 Nicotine dependence, cigarettes, uncomplicated: Secondary | ICD-10-CM

## 2022-03-08 DIAGNOSIS — I272 Pulmonary hypertension, unspecified: Secondary | ICD-10-CM

## 2022-03-08 DIAGNOSIS — R0902 Hypoxemia: Secondary | ICD-10-CM

## 2022-03-08 DIAGNOSIS — J449 Chronic obstructive pulmonary disease, unspecified: Secondary | ICD-10-CM

## 2022-03-08 DIAGNOSIS — M5106 Intervertebral disc disorders with myelopathy, lumbar region: Secondary | ICD-10-CM

## 2022-03-08 DIAGNOSIS — L98499 Non-pressure chronic ulcer of skin of other sites with unspecified severity: Secondary | ICD-10-CM

## 2022-03-08 DIAGNOSIS — M545 Low back pain: Secondary | ICD-10-CM

## 2022-03-08 MED ORDER — SPIRONOLACTONE 25 MG PO TAB
25 mg | ORAL_TABLET | Freq: Every day | ORAL | 3 refills | 90.00000 days | Status: AC
Start: 2022-03-08 — End: ?

## 2022-03-17 ENCOUNTER — Encounter: Admit: 2022-03-17 | Discharge: 2022-03-17 | Payer: MEDICARE

## 2022-03-19 ENCOUNTER — Encounter: Admit: 2022-03-19 | Discharge: 2022-03-19 | Payer: MEDICARE

## 2022-03-19 ENCOUNTER — Observation Stay: Admit: 2022-03-19 | Discharge: 2022-03-19 | Payer: MEDICARE

## 2022-03-19 DIAGNOSIS — R079 Chest pain, unspecified: Secondary | ICD-10-CM

## 2022-03-19 LAB — CBC AND DIFF
ABSOLUTE BASO COUNT: 0 K/UL (ref 0–0.20)
ABSOLUTE EOS COUNT: 0.1 K/UL (ref 0–0.45)
ABSOLUTE MONO COUNT: 0.6 K/UL (ref 0–0.80)
ABSOLUTE NEUTROPHIL: 3.9 K/UL (ref 1.8–7.0)
BASOPHILS %: 0 % (ref 0–2)
HEMOGLOBIN: 14 g/dL (ref 13.5–16.5)
MCHC: 33 g/dL (ref 32.0–36.0)
MPV: 7.9 FL — ABNORMAL HIGH (ref 7–11)
WBC COUNT: 8.2 K/UL (ref 4.5–11.0)

## 2022-03-19 LAB — COMPREHENSIVE METABOLIC PANEL
ALBUMIN: 3.9 g/dL (ref 3.5–5.0)
ALK PHOSPHATASE: 47 U/L (ref 25–110)
AST: 28 U/L (ref 7–40)
CALCIUM: 8.8 mg/dL — ABNORMAL HIGH (ref 8.5–10.6)
CO2: 25 MMOL/L (ref 21–30)
EGFR: >60 mL/min (ref >60)
GLUCOSE,PANEL: 95 mg/dL — ABNORMAL HIGH (ref <20)
SODIUM: 138 MMOL/L (ref 137–147)
TOTAL PROTEIN: 7.3 g/dL (ref 6.0–8.0)

## 2022-03-19 LAB — HIGH SENSITIVITY TROPONIN I 2 HOUR: HIGH SENSITIVITY TROPONIN I 2 HOUR: 22 ng/L — ABNORMAL HIGH (ref <20)

## 2022-03-19 LAB — LACTIC ACID(LACTATE): LACTIC ACID: 1.5 MMOL/L (ref 0.5–2.0)

## 2022-03-19 LAB — POC GLUCOSE: POC GLUCOSE: 234 mg/dL — ABNORMAL HIGH (ref 70–100)

## 2022-03-19 MED ORDER — DAPAGLIFLOZIN PROPANEDIOL 10 MG PO TAB
10 mg | Freq: Every day | ORAL | 0 refills | Status: AC
Start: 2022-03-19 — End: ?
  Administered 2022-03-20: 15:00:00 10 mg via ORAL

## 2022-03-19 MED ORDER — INSULIN ASPART 100 UNIT/ML SC FLEXPEN
0-6 [IU] | Freq: Before meals | SUBCUTANEOUS | 0 refills | Status: AC
Start: 2022-03-19 — End: ?
  Administered 2022-03-20: 03:00:00 1 [IU] via SUBCUTANEOUS

## 2022-03-19 MED ORDER — INSULIN NPH AND REGULAR HUMAN 100 UNIT/ML (70-30) SC INPN
70 [IU] | Freq: Once | SUBCUTANEOUS | 0 refills | Status: CP
Start: 2022-03-19 — End: ?
  Administered 2022-03-20: 03:00:00 70 [IU] via SUBCUTANEOUS

## 2022-03-19 MED ORDER — CLOPIDOGREL 75 MG PO TAB
75 mg | Freq: Every day | ORAL | 0 refills | Status: AC
Start: 2022-03-19 — End: ?
  Administered 2022-03-20: 15:00:00 75 mg via ORAL

## 2022-03-19 MED ORDER — ISOSORBIDE MONONITRATE 30 MG PO TB24
30 mg | Freq: Every morning | ORAL | 0 refills | Status: AC
Start: 2022-03-19 — End: ?
  Administered 2022-03-20: 15:00:00 30 mg via ORAL

## 2022-03-19 MED ORDER — SPIRONOLACTONE 25 MG PO TAB
25 mg | Freq: Every day | ORAL | 0 refills | Status: AC
Start: 2022-03-19 — End: ?
  Administered 2022-03-20: 15:00:00 25 mg via ORAL

## 2022-03-19 MED ORDER — TIOTROPIUM BROMIDE 2.5 MCG/ACTUATION IN MIST
2 | Freq: Every day | RESPIRATORY_TRACT | 0 refills | Status: AC
Start: 2022-03-19 — End: ?
  Administered 2022-03-20: 14:00:00 2 via RESPIRATORY_TRACT

## 2022-03-19 MED ORDER — MONTELUKAST 10 MG PO TAB
10 mg | Freq: Every evening | ORAL | 0 refills | Status: AC
Start: 2022-03-19 — End: ?
  Administered 2022-03-20: 03:00:00 10 mg via ORAL

## 2022-03-19 MED ORDER — CYANOCOBALAMIN (VITAMIN B-12) 500 MCG PO TAB
1000 ug | Freq: Every day | ORAL | 0 refills | Status: AC
Start: 2022-03-19 — End: ?
  Administered 2022-03-20: 15:00:00 1000 ug via ORAL

## 2022-03-19 MED ORDER — METOPROLOL SUCCINATE 50 MG PO TB24
50 mg | Freq: Two times a day (BID) | ORAL | 0 refills | Status: AC
Start: 2022-03-19 — End: ?
  Administered 2022-03-20 (×2): 50 mg via ORAL

## 2022-03-19 MED ORDER — TORSEMIDE 20 MG PO TAB
60 mg | Freq: Every day | ORAL | 0 refills | Status: AC
Start: 2022-03-19 — End: ?
  Administered 2022-03-20: 15:00:00 60 mg via ORAL

## 2022-03-19 MED ORDER — DAPTOMYCIN SYR
6 mg/kg | INTRAVENOUS | 0 refills | Status: AC
Start: 2022-03-19 — End: ?
  Administered 2022-03-20: 15:00:00 500 mg via INTRAVENOUS

## 2022-03-19 MED ORDER — ACETAMINOPHEN 500 MG PO TAB
500 mg | ORAL | 0 refills | Status: AC | PRN
Start: 2022-03-19 — End: ?

## 2022-03-19 MED ORDER — ALBUTEROL SULFATE 2.5 MG /3 ML (0.083 %) IN NEBU
2.5 mg | RESPIRATORY_TRACT | 0 refills | Status: AC | PRN
Start: 2022-03-19 — End: ?

## 2022-03-19 MED ORDER — MELATONIN 5 MG PO TAB
5 mg | Freq: Every evening | ORAL | 0 refills | Status: AC | PRN
Start: 2022-03-19 — End: ?
  Administered 2022-03-20: 03:00:00 5 mg via ORAL

## 2022-03-19 MED ORDER — HELP MEDICATION
Freq: Once | SUBCUTANEOUS | 0 refills | Status: DC
Start: 2022-03-19 — End: 2022-03-20

## 2022-03-19 MED ORDER — LEVOFLOXACIN 750 MG PO TAB
750 mg | Freq: Every day | ORAL | 0 refills | Status: AC
Start: 2022-03-19 — End: ?
  Administered 2022-03-20: 15:00:00 750 mg via ORAL

## 2022-03-20 ENCOUNTER — Encounter: Admit: 2022-03-20 | Discharge: 2022-03-20 | Payer: MEDICARE

## 2022-03-20 NOTE — Progress Notes
RT Adult Assessment Note    NAME:Bobbi Savian Mazon             MRN: [id number]             DOB:Jan 28, 1941          AGE: 81 y.o.  ADMISSION DATE: 03/19/2022             DAYS ADMITTED: LOS: 0 days    RT Treatment Plan:  Protocol Plan: Medications  Albuterol: Nebulizer PRN  Tiotropium: MDI Q Day    Protocol Plan: Procedures  PEP Therapy: Place a nursing order for "IS Q1h While Awake" for any of Lung Expansion indicators  Oxygen/Humidity: O2 to keep SpO2 > 92%, if on room air for > 24 hours and no other RT modalities are required, then D/C protocol  CPAP/BiPAP: BiPAP  SpO2: Continuous (Document SpO2 result Qshift)  Comment: 3L O2 w/ home Trilogy machine    Additional Comments:  Impressions of the patient: Patient resting in bed, NAD  Intervention(s)/outcome(s): See plan above  Patient education that was completed: None  Recommendations to the care team: Continue with current care plan    Vital Signs:  Pulse: 86  RR: 16 PER MINUTE  SpO2: 99 %  O2 Device: Nasal cannula  Liter Flow: 3 Lpm  O2%:    Breath Sounds: Decreased  Respiratory Effort:

## 2022-03-22 ENCOUNTER — Encounter: Admit: 2022-03-22 | Discharge: 2022-03-22 | Payer: MEDICARE

## 2022-03-26 ENCOUNTER — Encounter: Admit: 2022-03-26 | Discharge: 2022-03-26 | Payer: MEDICARE

## 2022-03-27 ENCOUNTER — Encounter: Admit: 2022-03-27 | Discharge: 2022-03-27 | Payer: MEDICARE

## 2022-03-27 MED ORDER — NITROGLYCERIN 0.4 MG SL SUBL
.4 mg | ORAL_TABLET | SUBLINGUAL | 3 refills | 9.00000 days | Status: AC | PRN
Start: 2022-03-27 — End: ?

## 2022-03-27 NOTE — Telephone Encounter
-----   Message from Michelle Piper sent at 03/26/2022 10:39 PM CDT -----  Regarding: Nitroglycerin 0.4 mg Refill  Contact: [phone]  I discovered [name] Nitro Tabs have expired & there are no refills left. Would you please send a new prescription to [facility] in [place], [place].    Please call me if you need any further information  Thank You  Pia Mau

## 2022-06-01 ENCOUNTER — Encounter: Admit: 2022-06-01 | Discharge: 2022-06-01 | Payer: MEDICARE

## 2022-06-07 ENCOUNTER — Ambulatory Visit: Admit: 2022-06-07 | Discharge: 2022-06-08 | Payer: MEDICARE

## 2022-06-07 ENCOUNTER — Encounter: Admit: 2022-06-07 | Discharge: 2022-06-07 | Payer: MEDICARE

## 2022-06-07 DIAGNOSIS — E119 Type 2 diabetes mellitus without complications: Secondary | ICD-10-CM

## 2022-06-07 DIAGNOSIS — K219 Gastro-esophageal reflux disease without esophagitis: Secondary | ICD-10-CM

## 2022-06-07 DIAGNOSIS — F17210 Nicotine dependence, cigarettes, uncomplicated: Secondary | ICD-10-CM

## 2022-06-07 DIAGNOSIS — R972 Elevated prostate specific antigen [PSA]: Secondary | ICD-10-CM

## 2022-06-07 DIAGNOSIS — C4490 Unspecified malignant neoplasm of skin, unspecified: Secondary | ICD-10-CM

## 2022-06-07 DIAGNOSIS — M5106 Intervertebral disc disorders with myelopathy, lumbar region: Secondary | ICD-10-CM

## 2022-06-07 DIAGNOSIS — G4733 Obstructive sleep apnea (adult) (pediatric): Secondary | ICD-10-CM

## 2022-06-07 DIAGNOSIS — I272 Pulmonary hypertension, unspecified: Secondary | ICD-10-CM

## 2022-06-07 DIAGNOSIS — Z9289 Personal history of other medical treatment: Secondary | ICD-10-CM

## 2022-06-07 DIAGNOSIS — E669 Obesity, unspecified: Secondary | ICD-10-CM

## 2022-06-07 DIAGNOSIS — C679 Malignant neoplasm of bladder, unspecified: Secondary | ICD-10-CM

## 2022-06-07 DIAGNOSIS — E7800 Pure hypercholesterolemia, unspecified: Secondary | ICD-10-CM

## 2022-06-07 DIAGNOSIS — Z9229 Personal history of other drug therapy: Secondary | ICD-10-CM

## 2022-06-07 DIAGNOSIS — C44310 Basal cell carcinoma of skin of unspecified parts of face: Secondary | ICD-10-CM

## 2022-06-07 DIAGNOSIS — B999 Unspecified infectious disease: Secondary | ICD-10-CM

## 2022-06-07 DIAGNOSIS — I70245 Atherosclerosis of native arteries of left leg with ulceration of other part of foot: Secondary | ICD-10-CM

## 2022-06-07 DIAGNOSIS — Z7901 Long term (current) use of anticoagulants: Secondary | ICD-10-CM

## 2022-06-07 DIAGNOSIS — M48061 Spinal stenosis, lumbar region without neurogenic claudication: Secondary | ICD-10-CM

## 2022-06-07 DIAGNOSIS — I4891 Unspecified atrial fibrillation: Secondary | ICD-10-CM

## 2022-06-07 DIAGNOSIS — I2510 Atherosclerotic heart disease of native coronary artery without angina pectoris: Secondary | ICD-10-CM

## 2022-06-07 DIAGNOSIS — I10 Essential (primary) hypertension: Secondary | ICD-10-CM

## 2022-06-07 DIAGNOSIS — Z9981 Dependence on supplemental oxygen: Secondary | ICD-10-CM

## 2022-06-07 DIAGNOSIS — M545 Low back pain: Secondary | ICD-10-CM

## 2022-06-07 DIAGNOSIS — J984 Other disorders of lung: Secondary | ICD-10-CM

## 2022-06-07 DIAGNOSIS — I219 Acute myocardial infarction, unspecified: Secondary | ICD-10-CM

## 2022-06-07 DIAGNOSIS — Z87891 Personal history of nicotine dependence: Secondary | ICD-10-CM

## 2022-06-07 DIAGNOSIS — R0902 Hypoxemia: Secondary | ICD-10-CM

## 2022-06-07 DIAGNOSIS — M4307 Spondylolysis, lumbosacral region: Secondary | ICD-10-CM

## 2022-06-07 DIAGNOSIS — Z8551 Personal history of malignant neoplasm of bladder: Secondary | ICD-10-CM

## 2022-06-07 DIAGNOSIS — I739 Peripheral vascular disease, unspecified: Secondary | ICD-10-CM

## 2022-06-07 DIAGNOSIS — I200 Unstable angina: Secondary | ICD-10-CM

## 2022-06-07 DIAGNOSIS — G4730 Sleep apnea, unspecified: Secondary | ICD-10-CM

## 2022-06-07 DIAGNOSIS — M1990 Unspecified osteoarthritis, unspecified site: Secondary | ICD-10-CM

## 2022-06-07 DIAGNOSIS — J449 Chronic obstructive pulmonary disease, unspecified: Secondary | ICD-10-CM

## 2022-06-07 DIAGNOSIS — E785 Hyperlipidemia, unspecified: Secondary | ICD-10-CM

## 2022-06-07 NOTE — Progress Notes
Date of Service: 06/07/2022    Ian Velez is a 81 y.o. male.       HPI       This was a telehealth visit and the patient consented to it.  We can achieve several things in a telehealth visit including discuss symptoms, review imaging and laboratory data and assess for overall health and comfort.  We can prescribe medications remotely.  However there are certain things we cannot do including a detailed examination.  The patient is aware of these limitations    Mr. Ian Velez and his wife joined in for a video telehealth visit which went really well.  This will there is fourth time doing this and I think they were amazed by how convenient and useful it was.  They live near [facility] so this worked out well.    The patient reports that the foot wound continues to heal slowly.  They are seeing their local wound care nurse every week and it seems she is pleased with the progress though it is quite slow.  There is no erythema or infection.  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  That is healing slowly as well.  He did develop cellulitis last month and I saw the infectious disease note from that time and they recommended proceeding with revascularization.  I discussed this with the patient but they feel that it was just a suggestion and since they are doing well with the wound they wanted to hold off which I think is reasonable.  He is on suppressive antibiotics now at least for the next several weeks.    He states his blood glucose is doing better with a hemoglobin A1c of 7.  He is compliant with all his medications.       Past medical history  Coronary artery disease with prior PCI with residual disease that is being medically managed by Dr. Sandria Manly and Dr. Riley Nearing at [facility].   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     11/15/2021 MRI Left foot ([facility]): Ulcer along the medial most aspect of the great toe distally is noted. There is extensive soft tissue swelling of the great toe extending towards the midfoot and hindfoot with post gadolinium enhancement consistent with cellulitis.    11/14/2021 US Duplex left lower extremity veins ([facility]): No evidence of dep venous thrombosis in the lower extremity veins as visualized.  10/31/2021 Bilateral lower ext arterial doppler  US ([facility]): 75% or greater stenosis involving the distal right SFA and/or proximal popliteal artery. Proximal trifurcation vessels on the right are not evaluated.       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 Qualitatively normal LV size and function, EF ~ 55%; 2. Unable to accurately assess diastolic function due to underlying atrial fibrillation; 3. Qualitatively the RV appears mildly dilated with mildly reduced systolic function 4. Qualitatively the RA appears mildly dilated; 5. Valvular structures were not well visualized.  No significant Doppler abnormalities; 6. Estimated peak systolic PA pressure =  45 mmHg; 7. No pericardial effusion      Mixed hyperlipidemia     History of bladder carcinoma     DM (diabetes mellitus), type 2 (HCC)     Stage 3 severe COPD by GOLD classification (HCC)     Chronic atrial fibrillation (HCC)     Coronary artery disease of native artery of native heart with stable angina pectoris (HCC)      11/07/2017 coronary angiogram  - Highly complex severe disease of the right coronary and circumflex with a complete occlusion of both the circumflex and right coronary artery with TIMI 1 flow distal in both arteries.Mild disease of the LAD and left main.Successful intervention of the right coronary, re-establishing normal flow.  There continues to be serial 90% stenoses in the distal right coronary artery, that are also heavily calcified.      Chronic GERD     On home oxygen therapy      3L      Chronic stable angina     Chronic anticoagulation          Review of Systems   All other systems reviewed and are negative.    Physical Exam    A detailed physical examination cannot be performed in the video telehealth visit.  The patient appeared to be comfortable and in good spirits.  His breathing appeared normal without use of accessory muscles of respiration.  His cognition and mentation were intact.  His speech was normal  Grossly neurological function seemed intact.        Cardiovascular Health Factors  Vitals BP Readings from Last 3 Encounters:   06/07/22 123/79   03/20/22 112/75   03/08/22 118/62     Wt Readings from Last 3 Encounters:   06/07/22 122 kg (269 lb)   03/20/22 120.9 kg (266 lb 9.6 oz)   03/08/22 118.4 kg (261 lb)     BMI Readings from Last 3 Encounters:   06/07/22 43.42 kg/m?   03/20/22 43.03 kg/m?   03/08/22 42.13 kg/m?      Smoking Social History     Tobacco Use   Smoking Status Former    Packs/day: 2.00    Years: 50.00    Additional pack years: 0.00    Total pack years: 100.00    Types: Cigarettes    Quit date: 07/12/2008    Years since quitting: 13.9   Smokeless Tobacco Never      Lipid Profile Cholesterol   Date Value Ref Range Status   12/20/2021 100  Final     HDL  Date Value Ref Range Status   12/20/2021 41  Final     LDL   Date Value Ref Range Status   12/20/2021 43  Final     Triglycerides   Date Value Ref Range Status   12/20/2021 120  Final      Blood Sugar Hemoglobin A1C   Date Value Ref Range Status   06/22/2020 8.4 (H) <=5.6 Final     Glucose   Date Value Ref Range Status   03/20/2022 66 (L) 70 - 100 MG/DL Final   16/04/9603 95 70 - 100 MG/DL Final   54/03/8118 147 (H) 74 - 106 Final     Glucose, POC   Date Value Ref Range Status   03/20/2022 195 (H) 70 - 100 MG/DL Final   82/95/6213 91 70 - 100 MG/DL Final   08/65/7846 962 (H) 70 - 100 MG/DL Final          Problems Addressed Today  No diagnosis found.    Assessment and Plan     In summary, the patient has slow healing of the left great toe wound without obvious infection per the wound care nurse.  He also has a wound in the left calf over the last few months that is slowly healing as well.  He has not had cellulitis or infection over the last couple of months of his foot but he did have one of the shin and is now on suppressive antibiotics.    He has advanced below the knee PAD and we have discussed that we could proceed with angiography with an eye towards intervention but I do not see a compelling reason today so the patient is hesitant.  I once again reiterated that they should continue to follow with wound care and take photographs regularly and if the wound stops healing or worsens but that would be a definite indication for proceeding.    We will reconnect with them in 3 months but they will call us through MyChart or phone if the wound worsens or he has active infection or stops healing    This note was dictated using the dragon speech recognition software.  Transcription errors may occur with the use of this software. Editing and proofreading were done by the author of this document.  In spite of the author's best effort to identify every error introduced by voice to text dictation, some errors that may represent misspelling or misstatements of what was dictated may persist.  If there are questions about content in this document please contact Dr. [name].           Current Medications (including today's revisions)   acetaminophen (TYLENOL) 325 mg tablet Take two tablets by mouth every 6 hours as needed for Pain.    albuterol 0.5% (PROVENTIL; VENTOLIN) 2.5 mg/0.5 mL nebulizer solution Inhale one each solution by nebulizer as directed four times daily as needed for Shortness of Breath or Wheezing.    atorvastatin (LIPITOR) 80 mg tablet Take one tablet by mouth daily.    canagliflozin (INVOKANA) 300 mg tablet Take one tablet by mouth every 24 hours.    clopiDOGreL (PLAVIX) 75 mg tablet Take one tablet by mouth daily.    cyanocobalamin (vitamin B-12) 500 mcg tablet Take two tablets by mouth daily.    ELDERBERRY FRUIT PO Take 2 tablets by mouth daily.    HYDROcodone/acetaminophen (NORCO) 5/325 mg tablet Take one tablet by mouth as Needed for Pain.    hydrocortisone (HYTONE) 2.5 % topical  cream APPLY TOPICALLY TO THE FACE TWICE DAILY FOR 1 TO 2 WEEKS AS NEEDED FLARES MIX IN THE HAND WITHH KETOCONAZOLE CREAM    insulin aspart 70/30 (+) (NOVOLOG MIX 70/30) 100 unit/mL (70-30) Inject eighty Units under the skin twice daily.    isosorbide mononitrate ER (IMDUR) 30 mg tablet,extended release 24 hr Take one tablet by mouth every morning.    levoFLOXacin (LEVAQUIN) 750 mg tablet Take one tablet by mouth daily.    metFORMIN (GLUCOPHAGE) 500 mg tablet Take one tablet by mouth twice daily with meals. Hold 48 hours after cath procedure.  Resume with evening dose on 11/09/17    metoprolol succinate XL (TOPROL XL) 50 mg extended release tablet Take one tablet by mouth twice daily.    montelukast (SINGULAIR) 10 mg tablet Take one tablet by mouth at bedtime daily.    MULTIVITAMIN PO Take 1 tablet by mouth daily.    nitroglycerin (NITROSTAT) 0.4 mg tablet Place one tablet under tongue every 5 minutes as needed for Chest Pain. Max of 3 tablets, call 911.    potassium chloride SR (K-DUR) 20 mEq tablet Take one tablet by mouth three times daily. Take with a meal and a full glass of water.    rivaroxaban (XARELTO) 20 mg tablet Take one tablet by mouth daily. Take with food.    spironolactone (ALDACTONE) 25 mg tablet Take one tablet by mouth daily. Take with food.    tiotropium bromide (SPIRIVA) 18 mcg capsule for inhaler Place one capsule into inhaler and inhale into lungs as directed daily.    torsemide (DEMADEX) 20 mg tablet Take three tablets by mouth daily.

## 2022-06-07 NOTE — Patient Instructions
Follow-Up:    -Thank you for allowing [name] to participate in your care today. Your After Visit Summary is being completed by [name], RN.    -We would like you to follow up in  3 months with [name], MD  -The schedule is released approximately 4-5 months in advance. You will be called by our scheduling department to make an appointment and you will also receive a notification via MyChart to self-schedule.  However, if you would like to call to make this appointment, please call [phone].    Changes From Today's Office Visit   Follow up with Dr. [name] via Telehealth in 3 months     Contacting our office:    -For NON-URGENT questions please contact us via message through your MyChart account.   -For all medication refills please contact your pharmacy or send a request through MyChart.     -For all questions that may need to be addressed urgently please call the [facility] nursing triage line at [phone] Monday - Friday 8am-5pm only. Please leave a detailed message with your name, date of birth, and reason for your call.  If your message is received before 3:30pm, every effort will be made to call you back the same day.  Please allow time for [name] to review your chart prior to call back.     -Should you have an urgent concern over the weekend/nights, the on-call triage line is [phone].    Ian Velez nursing team fax number: [phone]    -You may receive a survey in the upcoming weeks from [facility] [place] [facility]. Your feedback is important to [name] and helps [name] continue to improve patient care and patient satisfaction.     -Please feel free to call our Financial Department at [phone] with any questions or concerns about estimated cost of testing or imaging ordered today. We are happy to provide CPT codes upon request.    Results & Testing Follow Up:    -Please allow 5-7 business days for the results of any testing to be reviewed. Please call our office if you have not heard from a nurse within this time frame.    -Should you choose to complete testing at an outside facility, please contact our office after completion of testing so that we can ensure that we have received results for your provider to review.    Lab and test results:  As a part of the CARES act, starting 10/02/2019, some results will be released to you via MyChart immediately and automatically.  You may see results before your provider sees them; however, your provider will review all these results and then they, or one of their team, will notify you of result information and recommendations.   Critical results will be addressed immediately, but otherwise, please allow [name] time to get back with you prior to you reaching out to [name] for questions.  This will usually take about 72 hours for labs and 5-7 days for procedure test results.

## 2022-07-04 ENCOUNTER — Encounter: Admit: 2022-07-04 | Discharge: 2022-07-04 | Payer: MEDICARE

## 2022-07-10 ENCOUNTER — Encounter: Admit: 2022-07-10 | Discharge: 2022-07-10 | Payer: MEDICARE

## 2022-07-10 DIAGNOSIS — I219 Acute myocardial infarction, unspecified: Secondary | ICD-10-CM

## 2022-07-10 DIAGNOSIS — J449 Chronic obstructive pulmonary disease, unspecified: Secondary | ICD-10-CM

## 2022-07-10 DIAGNOSIS — I10 Essential (primary) hypertension: Secondary | ICD-10-CM

## 2022-07-10 DIAGNOSIS — C44310 Basal cell carcinoma of skin of unspecified parts of face: Secondary | ICD-10-CM

## 2022-07-10 DIAGNOSIS — R0902 Hypoxemia: Secondary | ICD-10-CM

## 2022-07-10 DIAGNOSIS — E785 Hyperlipidemia, unspecified: Secondary | ICD-10-CM

## 2022-07-10 DIAGNOSIS — M5106 Intervertebral disc disorders with myelopathy, lumbar region: Secondary | ICD-10-CM

## 2022-07-10 DIAGNOSIS — R972 Elevated prostate specific antigen [PSA]: Secondary | ICD-10-CM

## 2022-07-10 DIAGNOSIS — I70245 Atherosclerosis of native arteries of left leg with ulceration of other part of foot: Secondary | ICD-10-CM

## 2022-07-10 DIAGNOSIS — M545 Low back pain: Secondary | ICD-10-CM

## 2022-07-10 DIAGNOSIS — Z9289 Personal history of other medical treatment: Secondary | ICD-10-CM

## 2022-07-10 DIAGNOSIS — M48061 Spinal stenosis, lumbar region without neurogenic claudication: Secondary | ICD-10-CM

## 2022-07-10 DIAGNOSIS — M1990 Unspecified osteoarthritis, unspecified site: Secondary | ICD-10-CM

## 2022-07-10 DIAGNOSIS — E669 Obesity, unspecified: Secondary | ICD-10-CM

## 2022-07-10 DIAGNOSIS — Z8551 Personal history of malignant neoplasm of bladder: Secondary | ICD-10-CM

## 2022-07-10 DIAGNOSIS — G4730 Sleep apnea, unspecified: Secondary | ICD-10-CM

## 2022-07-10 DIAGNOSIS — J984 Other disorders of lung: Secondary | ICD-10-CM

## 2022-07-10 DIAGNOSIS — F17210 Nicotine dependence, cigarettes, uncomplicated: Secondary | ICD-10-CM

## 2022-07-10 DIAGNOSIS — E119 Type 2 diabetes mellitus without complications: Secondary | ICD-10-CM

## 2022-07-10 DIAGNOSIS — C679 Malignant neoplasm of bladder, unspecified: Secondary | ICD-10-CM

## 2022-07-10 DIAGNOSIS — I272 Pulmonary hypertension, unspecified: Secondary | ICD-10-CM

## 2022-07-10 DIAGNOSIS — Z7901 Long term (current) use of anticoagulants: Secondary | ICD-10-CM

## 2022-07-10 DIAGNOSIS — B999 Unspecified infectious disease: Secondary | ICD-10-CM

## 2022-07-10 DIAGNOSIS — Z87891 Personal history of nicotine dependence: Secondary | ICD-10-CM

## 2022-07-10 DIAGNOSIS — G4733 Obstructive sleep apnea (adult) (pediatric): Secondary | ICD-10-CM

## 2022-07-10 DIAGNOSIS — C4490 Unspecified malignant neoplasm of skin, unspecified: Secondary | ICD-10-CM

## 2022-07-10 DIAGNOSIS — Z9981 Dependence on supplemental oxygen: Secondary | ICD-10-CM

## 2022-07-10 DIAGNOSIS — I2510 Atherosclerotic heart disease of native coronary artery without angina pectoris: Secondary | ICD-10-CM

## 2022-07-10 DIAGNOSIS — I4891 Unspecified atrial fibrillation: Secondary | ICD-10-CM

## 2022-07-10 DIAGNOSIS — Z9229 Personal history of other drug therapy: Secondary | ICD-10-CM

## 2022-07-10 DIAGNOSIS — I200 Unstable angina: Secondary | ICD-10-CM

## 2022-07-10 DIAGNOSIS — I739 Peripheral vascular disease, unspecified: Secondary | ICD-10-CM

## 2022-07-10 DIAGNOSIS — E7800 Pure hypercholesterolemia, unspecified: Secondary | ICD-10-CM

## 2022-07-10 DIAGNOSIS — M4307 Spondylolysis, lumbosacral region: Secondary | ICD-10-CM

## 2022-07-10 DIAGNOSIS — K219 Gastro-esophageal reflux disease without esophagitis: Secondary | ICD-10-CM

## 2022-07-10 NOTE — Patient Instructions
Thank you for visiting our office today.    We would like to make the following medication adjustments:  NONE       Otherwise continue the same medications as you have been doing.          We will be pursuing the following tests after your appointment today:            We will plan to see you back in 6-7 months.  Please call us in the meantime with any questions or concerns.        Please allow 5-7 business days for our providers to review your results. All normal results will go to MyChart. If you do not have Mychart, it is strongly recommended to get this so you can easily view all your results. If you do not have mychart, we will attempt to call you once with normal lab and testing results. If we cannot reach you by phone with normal results, we will send you a letter.  If you have not heard the results of your testing after one week please give [name] a call.       Your Cardiovascular Medicine [place] Team Richardson Landry, Rene Kocher, Threasa Beards, and [place])  phone number is [phone].

## 2022-09-08 ENCOUNTER — Encounter: Admit: 2022-09-08 | Discharge: 2022-09-08 | Payer: MEDICARE

## 2022-09-10 ENCOUNTER — Encounter: Admit: 2022-09-10 | Discharge: 2022-09-10 | Payer: MEDICARE

## 2022-09-12 ENCOUNTER — Ambulatory Visit: Admit: 2022-09-12 | Discharge: 2022-09-13 | Payer: MEDICARE

## 2022-09-12 ENCOUNTER — Encounter: Admit: 2022-09-12 | Discharge: 2022-09-12 | Payer: MEDICARE

## 2022-09-12 DIAGNOSIS — I219 Acute myocardial infarction, unspecified: Secondary | ICD-10-CM

## 2022-09-12 DIAGNOSIS — I2510 Atherosclerotic heart disease of native coronary artery without angina pectoris: Secondary | ICD-10-CM

## 2022-09-12 DIAGNOSIS — M5106 Intervertebral disc disorders with myelopathy, lumbar region: Secondary | ICD-10-CM

## 2022-09-12 DIAGNOSIS — Z9229 Personal history of other drug therapy: Secondary | ICD-10-CM

## 2022-09-12 DIAGNOSIS — M545 Low back pain: Secondary | ICD-10-CM

## 2022-09-12 DIAGNOSIS — B999 Unspecified infectious disease: Secondary | ICD-10-CM

## 2022-09-12 DIAGNOSIS — G4733 Obstructive sleep apnea (adult) (pediatric): Secondary | ICD-10-CM

## 2022-09-12 DIAGNOSIS — C4490 Unspecified malignant neoplasm of skin, unspecified: Secondary | ICD-10-CM

## 2022-09-12 DIAGNOSIS — E669 Obesity, unspecified: Secondary | ICD-10-CM

## 2022-09-12 DIAGNOSIS — I739 Peripheral vascular disease, unspecified: Secondary | ICD-10-CM

## 2022-09-12 DIAGNOSIS — E119 Type 2 diabetes mellitus without complications: Secondary | ICD-10-CM

## 2022-09-12 DIAGNOSIS — J984 Other disorders of lung: Secondary | ICD-10-CM

## 2022-09-12 DIAGNOSIS — E7800 Pure hypercholesterolemia, unspecified: Secondary | ICD-10-CM

## 2022-09-12 DIAGNOSIS — Z87891 Personal history of nicotine dependence: Secondary | ICD-10-CM

## 2022-09-12 DIAGNOSIS — I200 Unstable angina: Secondary | ICD-10-CM

## 2022-09-12 DIAGNOSIS — I4891 Unspecified atrial fibrillation: Secondary | ICD-10-CM

## 2022-09-12 DIAGNOSIS — I272 Pulmonary hypertension, unspecified: Secondary | ICD-10-CM

## 2022-09-12 DIAGNOSIS — F17210 Nicotine dependence, cigarettes, uncomplicated: Secondary | ICD-10-CM

## 2022-09-12 DIAGNOSIS — I70245 Atherosclerosis of native arteries of left leg with ulceration of other part of foot: Secondary | ICD-10-CM

## 2022-09-12 DIAGNOSIS — R0902 Hypoxemia: Secondary | ICD-10-CM

## 2022-09-12 DIAGNOSIS — M4307 Spondylolysis, lumbosacral region: Secondary | ICD-10-CM

## 2022-09-12 DIAGNOSIS — E785 Hyperlipidemia, unspecified: Secondary | ICD-10-CM

## 2022-09-12 DIAGNOSIS — M1990 Unspecified osteoarthritis, unspecified site: Secondary | ICD-10-CM

## 2022-09-12 DIAGNOSIS — C44310 Basal cell carcinoma of skin of unspecified parts of face: Secondary | ICD-10-CM

## 2022-09-12 DIAGNOSIS — K219 Gastro-esophageal reflux disease without esophagitis: Secondary | ICD-10-CM

## 2022-09-12 DIAGNOSIS — Z7901 Long term (current) use of anticoagulants: Secondary | ICD-10-CM

## 2022-09-12 DIAGNOSIS — G4730 Sleep apnea, unspecified: Secondary | ICD-10-CM

## 2022-09-12 DIAGNOSIS — R972 Elevated prostate specific antigen [PSA]: Secondary | ICD-10-CM

## 2022-09-12 DIAGNOSIS — Z8551 Personal history of malignant neoplasm of bladder: Secondary | ICD-10-CM

## 2022-09-12 DIAGNOSIS — M48061 Spinal stenosis, lumbar region without neurogenic claudication: Secondary | ICD-10-CM

## 2022-09-12 DIAGNOSIS — Z9289 Personal history of other medical treatment: Secondary | ICD-10-CM

## 2022-09-12 DIAGNOSIS — I10 Essential (primary) hypertension: Secondary | ICD-10-CM

## 2022-09-12 DIAGNOSIS — Z9981 Dependence on supplemental oxygen: Secondary | ICD-10-CM

## 2022-09-12 DIAGNOSIS — J449 Chronic obstructive pulmonary disease, unspecified: Secondary | ICD-10-CM

## 2022-09-12 DIAGNOSIS — C679 Malignant neoplasm of bladder, unspecified: Secondary | ICD-10-CM

## 2022-09-12 MED ORDER — ASPIRIN 81 MG PO TBEC
81 mg | ORAL_TABLET | Freq: Every day | ORAL | 1 refills | Status: AC
Start: 2022-09-12 — End: ?

## 2022-09-12 NOTE — Progress Notes
Date of Service: 09/12/2022              Chief Complaint   Patient presents with    Wound       History of Present Illness      This was a telehealth visit and the patient consented to it.  We can achieve several things in a telehealth visit including discuss symptoms, review imaging and laboratory data and assess for overall health and comfort.  We can prescribe medications remotely.  However there are certain things we cannot do including a detailed examination.  The patient is aware of these limitations    We had a good telehealth visit with both the patient and his wife participating.  They have been sending me pictures to show the progression of his foot wound and is now 90% healed without infection.  I have seen them a few months ago for a second opinion for below the knee PAD and angiography with possible revascularization had been recommended.  The patient and his wife are hesitant and when I saw them I felt that the wound may heal on its own and with his diabetes and distal disease we agreed with a more conservative approach so we are thankful that things are looking good.  They have been getting excellent wound care in [facility] and are also looking after the foot themselves very well    In the meantime they have been following with Dr. Sandria Manly in our [facility] cardiology office and things seem to be going well in that regard as well.    Past medical history  Coronary artery disease with prior PCI in 2019 with an attempted revascularization of a CTO of the RCA which was unsuccessful and known occlusion of the circumflex artery with residual disease that is being medically managed by Dr. Sandria Manly and Dr. Riley Nearing at [facility].   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     DM (diabetes mellitus), type 2 (HCC)     Elevated PSA     Facial basal cell cancer     Former cigarette smoker     High cholesterol     History of bladder carcinoma     History of blood transfusion     HX: anticoagulation     Hyperlipidemia     Hypertension     Hypoxemia     Infection     Cellulitis left leg    Low back pain     Lumbosacral spondylolysis     Lung disease     COPD    Myocardial  infarction (HCC)     OA (osteoarthritis)     Obesity     Obesity, Class III, BMI 40-49.9 (morbid obesity) (HCC)     Obstructive sleep apnea     On home oxygen therapy     3L    Peripheral artery disease (HCC)     Pulmonary hypertension (HCC)     Skin cancer     Sleep apnea     Spinal stenosis of lumbar region     Stage 3 severe COPD by GOLD classification (HCC)     Unstable angina [facility])        Surgical History:   Procedure Laterality Date    LEG SURGERY  07/04/1975    left leg rapri    COLONOSCOPY  2008    HX SKIN RESECTION  2009    basal cell ca removed of left cheek    CARDIAC CATHERIZATION  08/24/2017    ANGIOGRAPHY CORONARY ARTERY WITH LEFT HEART CATHETERIZATION N/A 11/07/2017    Performed by Greig Castilla, MD at [facility] CATH LAB    POSSIBLE PERCUTANEOUS CORONARY STENT PLACEMENT WITH ANGIOPLASTY N/A 11/07/2017    Performed by Greig Castilla, MD at [facility] CATH LAB    HX ADENOIDECTOMY      HX CORONARY STENT PLACEMENT  11/07/2017    HX CYSTECTOMY PARTIAL CYSTECTOMY    HX TONSILLECTOMY      HX VASECTOMY      ROTATOR CUFF REPAIR Left     TONSILLECTOMY      WISDOM TEETH EXTRACTION         Allergies:  Allergies   Allergen Reactions    Iodinated Contrast Media UNKNOWN    Telepaque [Iopanoic Acid] UNKNOWN       Medication List:   acetaminophen (TYLENOL) 325 mg tablet Take two tablets by mouth every 6 hours as needed for Pain.    albuterol 0.5% (PROVENTIL; VENTOLIN) 2.5 mg/0.5 mL nebulizer solution Inhale one each solution by nebulizer as directed four times daily as needed for Shortness of Breath or Wheezing.    atorvastatin (LIPITOR) 80 mg tablet Take one tablet by mouth daily.    canagliflozin (INVOKANA) 300 mg tablet Take one tablet by mouth every 24 hours.    clopiDOGreL (PLAVIX) 75 mg tablet Take one tablet by mouth daily.    cyanocobalamin (vitamin B-12) 500 mcg tablet Take two tablets by mouth daily.    doxycycline hyclate (VIBRACIN) 100 mg tablet Take one tablet by mouth twice daily.    ELDERBERRY FRUIT PO Take 2 tablets by mouth daily.    HYDROcodone/acetaminophen (NORCO) 5/325 mg tablet Take one tablet by mouth as Needed for Pain.    hydrocortisone (HYTONE) 2.5 % topical cream APPLY TOPICALLY TO THE FACE TWICE DAILY FOR 1 TO 2 WEEKS AS NEEDED FLARES MIX IN THE HAND WITHH KETOCONAZOLE CREAM    insulin aspart 70/30 (+) (NOVOLOG MIX 70/30) 100 unit/mL (70-30) Inject eighty Units under the skin twice daily.    isosorbide mononitrate ER (IMDUR) 30 mg tablet,extended release 24 hr Take one tablet by mouth every morning.    levoFLOXacin (LEVAQUIN) 750 mg tablet Take one tablet by mouth daily.    metFORMIN (GLUCOPHAGE) 500 mg tablet Take one tablet by mouth twice daily with meals. Hold 48 hours after cath procedure.  Resume with evening dose on 11/09/17    metoprolol succinate XL (TOPROL XL) 50 mg extended release tablet Take one tablet by mouth twice daily.    montelukast (SINGULAIR) 10 mg tablet Take one tablet  by mouth at bedtime daily.    MULTIVITAMIN PO Take 1 tablet by mouth daily.    nitroglycerin (NITROSTAT) 0.4 mg tablet Place one tablet under tongue every 5 minutes as needed for Chest Pain. Max of 3 tablets, call 911.    potassium chloride SR (K-DUR) 20 mEq tablet Take one tablet by mouth three times daily. Take with a meal and a full glass of water.    rivaroxaban (XARELTO) 20 mg tablet Take one tablet by mouth daily. Take with food.    spironolactone (ALDACTONE) 25 mg tablet Take one tablet by mouth daily. Take with food.    SYMBICORT 80-4.5 mcg/actuation aerosol inhaler Inhale 2 PUFFS IN THE morning AND AT bedtime. RINSE MOUTH AND THROAT AFTER USE TO REDUCE AFTERTASTE AND INCIDENCE OF CANDIDIASIS. DO NOT SWALLOW MOUTH WITH WATER AFTER USE.    tiotropium bromide (SPIRIVA) 18 mcg capsule for inhaler Place one capsule into inhaler and inhale into lungs as directed daily.    torsemide (DEMADEX) 20 mg tablet Take three tablets by mouth daily.       Social History:   reports that he quit smoking about 14 years ago. His smoking use included cigarettes. He started smoking about 64 years ago. He has a 100 pack-year smoking history. He has never used smokeless tobacco. He reports that he does not currently use alcohol after a past usage of about 4.0 standard drinks of alcohol per week. He reports that he does not use drugs.    Family History   Problem Relation Age of Onset    Heart Failure Mother     Diabetes Mother     Heart Attack Father     Heart Attack Sister        Review of Systems   Constitutional: Negative.    HENT: Negative.     Eyes: Negative.    Respiratory: Negative.     Cardiovascular: Negative.    Gastrointestinal: Negative.    Endocrine: Negative.    Genitourinary: Negative.    Musculoskeletal: Negative.    Skin: Negative.    Allergic/Immunologic: Negative.    Neurological: Negative.    Hematological: Negative.    Psychiatric/Behavioral: Negative.     All other systems reviewed and are negative.            There were no vitals filed for this visit.  There is no height or weight on file to calculate BMI.     Physical Exam    A detailed physical examination cannot be performed in the video telehealth visit.  The patient appeared to be comfortable and in good spirits.  His breathing appeared normal without use of accessory muscles of respiration.  His cognition and mentation were intact.  His speech was normal  Grossly neurological function seemed intact.      Assessment and Plan:    The patient and his wife report that the left great toe wound is about 90% healed.  I am thankful for this and that also means that we do not have to perform angiography.  He has a history of significant diabetic neuropathy and likely has very distal disease and there is a prior history of a surgical ligation of one of the left calf vessels in 1977 after crush injury.  Duplex ultrasound has shown no femoral-popliteal disease and biphasic flow in the peroneal and dorsalis pedis but no flow in the PT and also in the more proximal anterior tibial    I have asked him to continue to  follow with wound care and continue to wear extremely protective shoes as they are until the wound is completely healed and then after that lifelong good footcare    They will continue to follow with Dr. Sandria Manly in our [facility] office and will reach out to me if the wound does not heal artery appears or the other leg symptoms.  I do not think doing follow-up vascular studies would be helpful as we will go with clinical indication.  They have also not had any cellulitis in the last several months    He is on Xarelto on a chronic basis and also on clopidogrel.  I have asked him to discontinue clopidogrel and switch to a baby aspirin which she will even take every other day    This note was dictated using the dragon speech recognition software.  Transcription errors may occur with the use of this software. Editing and proofreading were done by the author of this document.  In spite of the author's best effort to identify every error introduced by voice to text dictation, some errors that may represent misspelling or misstatements of what was dictated may persist.  If there are questions about content in this document please contact Dr. [name].

## 2022-10-02 ENCOUNTER — Encounter: Admit: 2022-10-02 | Discharge: 2022-10-02 | Payer: MEDICARE

## 2022-10-02 DIAGNOSIS — I519 Heart disease, unspecified: Secondary | ICD-10-CM

## 2022-10-02 DIAGNOSIS — I2089 Chronic stable angina (HCC): Secondary | ICD-10-CM

## 2022-10-02 DIAGNOSIS — I482 Chronic atrial fibrillation, unspecified: Secondary | ICD-10-CM

## 2022-10-02 DIAGNOSIS — I25118 Atherosclerotic heart disease of native coronary artery with other forms of angina pectoris: Secondary | ICD-10-CM

## 2022-10-02 MED ORDER — METOPROLOL SUCCINATE 50 MG PO TB24
50 mg | ORAL_TABLET | Freq: Two times a day (BID) | ORAL | 3 refills | 90.00000 days | Status: AC
Start: 2022-10-02 — End: ?

## 2022-11-06 ENCOUNTER — Encounter: Admit: 2022-11-06 | Discharge: 2022-11-06 | Payer: MEDICARE

## 2022-11-06 DIAGNOSIS — I25118 Atherosclerotic heart disease of native coronary artery with other forms of angina pectoris: Secondary | ICD-10-CM

## 2022-11-06 MED ORDER — ISOSORBIDE MONONITRATE 30 MG PO TB24
30 mg | ORAL_TABLET | Freq: Every morning | ORAL | 3 refills | 90.00000 days | Status: AC
Start: 2022-11-06 — End: ?

## 2022-11-20 ENCOUNTER — Encounter: Admit: 2022-11-20 | Discharge: 2022-11-20 | Payer: MEDICARE

## 2022-11-20 DIAGNOSIS — E782 Mixed hyperlipidemia: Secondary | ICD-10-CM

## 2022-11-20 DIAGNOSIS — I25118 Atherosclerotic heart disease of native coronary artery with other forms of angina pectoris: Secondary | ICD-10-CM

## 2022-11-20 MED ORDER — ATORVASTATIN 80 MG PO TAB
80 mg | ORAL_TABLET | Freq: Every day | ORAL | 3 refills | Status: AC
Start: 2022-11-20 — End: ?

## 2022-12-01 ENCOUNTER — Encounter: Admit: 2022-12-01 | Discharge: 2022-12-01 | Payer: MEDICARE

## 2023-01-26 ENCOUNTER — Encounter: Admit: 2023-01-26 | Discharge: 2023-01-26 | Payer: MEDICARE

## 2023-01-29 ENCOUNTER — Encounter: Admit: 2023-01-29 | Discharge: 2023-01-29 | Payer: MEDICARE

## 2023-01-31 ENCOUNTER — Encounter: Admit: 2023-01-31 | Discharge: 2023-01-31 | Payer: MEDICARE

## 2023-02-02 ENCOUNTER — Encounter: Admit: 2023-02-02 | Discharge: 2023-02-02 | Payer: MEDICARE

## 2023-02-15 LAB — BASIC METABOLIC PANEL
BLD UREA NITROGEN: 17
CHLORIDE: 101
CO2: 24
POTASSIUM: 4.5
SODIUM: 141

## 2023-02-15 LAB — BNP (B-TYPE NATRIURETIC PEPTI): BNP: 355 — ABNORMAL HIGH (ref <300)

## 2023-02-16 ENCOUNTER — Encounter: Admit: 2023-02-16 | Discharge: 2023-02-16 | Payer: MEDICARE

## 2023-02-16 ENCOUNTER — Ambulatory Visit: Admit: 2023-02-16 | Discharge: 2023-02-17 | Payer: MEDICARE

## 2023-02-16 DIAGNOSIS — I272 Pulmonary hypertension, unspecified: Secondary | ICD-10-CM

## 2023-02-16 DIAGNOSIS — R0602 Shortness of breath: Secondary | ICD-10-CM

## 2023-02-16 DIAGNOSIS — I5032 Chronic diastolic (congestive) heart failure: Secondary | ICD-10-CM

## 2023-02-16 MED ORDER — SODIUM CHLORIDE 0.9 % IJ SOLN
10 mL | Freq: Once | INTRAVENOUS | 0 refills | Status: CP
Start: 2023-02-16 — End: ?

## 2023-02-16 MED ORDER — PERFLUTREN LIPID MICROSPHERES 1.1 MG/ML IV SUSP
1-10 mL | Freq: Once | INTRAVENOUS | 0 refills | Status: CP | PRN
Start: 2023-02-16 — End: ?

## 2023-02-19 ENCOUNTER — Encounter: Admit: 2023-02-19 | Discharge: 2023-02-19 | Payer: MEDICARE

## 2023-02-19 DIAGNOSIS — R0602 Shortness of breath: Secondary | ICD-10-CM

## 2023-02-19 DIAGNOSIS — I272 Pulmonary hypertension, unspecified: Secondary | ICD-10-CM

## 2023-02-19 DIAGNOSIS — I5032 Chronic diastolic (congestive) heart failure: Secondary | ICD-10-CM

## 2023-02-19 DIAGNOSIS — I10 Essential (primary) hypertension: Secondary | ICD-10-CM

## 2023-02-21 ENCOUNTER — Encounter: Admit: 2023-02-21 | Discharge: 2023-02-21 | Payer: MEDICARE

## 2023-02-21 DIAGNOSIS — I482 Chronic atrial fibrillation, unspecified: Secondary | ICD-10-CM

## 2023-02-21 DIAGNOSIS — I5032 Chronic diastolic (congestive) heart failure: Secondary | ICD-10-CM

## 2023-02-21 DIAGNOSIS — I10 Essential (primary) hypertension: Secondary | ICD-10-CM

## 2023-02-21 NOTE — Telephone Encounter
-----   Message from Loura Halt, MD sent at 02/20/2023  6:31 AM CDT -----  Hey guys.  Ian Velez looks like he has quite a bit of volume on his echocardiogram.  This may explain some of his shortness of breath.  Can we increase his torsemide to 60 mg twice daily for 7 days.  I am not sure if he is on any potassium supplementation but we should increase that to match the increase in diuretics.  Plan for BMP in 1 week.  He should watch his weight daily so we know for making progress.  It is pretty tough to tell by physical exam what his volume status is.  His pulmonary pressures are pretty high on the echocardiogram so for not able to get much volume off we may need to try admitting him to the hospital and diuresing him.  Lets see how he handles this attempt at oral diuretics.   See me back in 4-6 weeks.  Will need to find a spot to work him.  Thank you!

## 2023-02-21 NOTE — Telephone Encounter
Called and spoke with Ian Velez and his wife.  They verbalized understanding.  Lab order faxed to [facility] per patient request.  Pt will continue to monitor his weight.  Will check in on patient in one week.

## 2023-02-21 NOTE — Telephone Encounter
Left VM requesting callback to discuss results and recommendations.  Sent mychart message with recommendations as well.

## 2023-03-02 ENCOUNTER — Encounter: Admit: 2023-03-02 | Discharge: 2023-03-02 | Payer: MEDICARE

## 2023-03-02 DIAGNOSIS — I482 Chronic atrial fibrillation, unspecified: Secondary | ICD-10-CM

## 2023-03-02 DIAGNOSIS — I10 Essential (primary) hypertension: Secondary | ICD-10-CM

## 2023-03-02 DIAGNOSIS — I5032 Chronic diastolic (congestive) heart failure: Secondary | ICD-10-CM

## 2023-03-09 ENCOUNTER — Encounter: Admit: 2023-03-09 | Discharge: 2023-03-09 | Payer: MEDICARE

## 2023-03-16 ENCOUNTER — Encounter: Admit: 2023-03-16 | Discharge: 2023-03-16 | Payer: MEDICARE

## 2023-03-16 DIAGNOSIS — I10 Essential (primary) hypertension: Secondary | ICD-10-CM

## 2023-03-16 DIAGNOSIS — I482 Chronic atrial fibrillation, unspecified: Secondary | ICD-10-CM

## 2023-03-16 DIAGNOSIS — I5032 Chronic diastolic (congestive) heart failure: Secondary | ICD-10-CM

## 2023-03-16 DIAGNOSIS — I272 Pulmonary hypertension, unspecified: Secondary | ICD-10-CM

## 2023-03-16 NOTE — Telephone Encounter
Called and spoke with [name]'s wife [name].  Discussed recommendations.  She is agreeable to plan.  Scheduled pt for stress test and OV with Dr. Sandria Manly.  She will increase his torsemide and monitor weight/BP/HR and symptoms and update [name] of his status.  Lab orders faxed [facility] per patient request.

## 2023-03-16 NOTE — Telephone Encounter
-----   Message from Ian Halt, MD sent at 03/16/2023 12:35 PM CDT -----  Regarding: RE: Cardiac Clearance  This going to be challenging.  We are trying to treat his decompensated heart failure as an outpatient without really knowing what his dry weight is.  I think it would be helpful to get his weight under 260 pounds as he seemed to feel better around 262 pounds.  I would recommend increasing torsemide to 60 mg twice daily for 7 days.  Then drop it back down to torsemide 60 mg daily indefinitely.  Please get basic metabolic panel and BNP in 1 week.  Please work him into my schedule on 10/4.    What is the urgency of this surgery?  This is need to occur immediately or in a matter of weeks, months?      Please arrange regadenoson myocardial perfusion stress test ASAP.    Regarding his anticoagulation we will need to bridge with Lovenox prior to surgery.  Once we have the surgical day we can discuss how to do this.    Thanks!  ----- Message -----  From: Ian Boga, RN  Sent: 03/13/2023  10:32 AM CDT  To: Ian Oiler, MD  Subject: Cardiac Clearance                                Ian Velez,    Ian Velez is needing cardiac clearance for a transurethral resection of bladder tumor.  He recently had an echo that showed fluid overload.  He did well with the increased dose of torsemide and his symptoms of shortness of breath improved.  His labs were stable as well.  In your note reviewing his echo, you mentioned that we may need to work him in to see you in 4 weeks.  I didn't know if you wanted to Ian Velez your clinic day on 10/4.  You are already at 18 patients.  Also, wanted to check and see if he would need any further testing prior to the urology surgery.    Also, he is on Xarelto.  If cleared for surgery, is he okay to hold for procedure?  He is on Xarelto for atrial fibrillation.  His CHADSVASC score is 6.    Please let me know your recommendations and I will follow up with the patient.      Thank you for your help!  Ian Velez

## 2023-03-22 ENCOUNTER — Encounter: Admit: 2023-03-22 | Discharge: 2023-03-22 | Payer: MEDICARE

## 2023-03-22 MED ORDER — TORSEMIDE 20 MG PO TAB
ORAL_TABLET | ORAL | 3 refills | 67.50000 days | Status: AC
Start: 2023-03-22 — End: ?

## 2023-03-23 ENCOUNTER — Encounter: Admit: 2023-03-23 | Discharge: 2023-03-23 | Payer: MEDICARE

## 2023-03-23 NOTE — Telephone Encounter
Discussed stress test instructions with patient's spouse including date, time, and location. Instructions included the following:    NO DECAF OR CAFFEINE 24 HOURS PRIOR TO TEST. Examples: coffee, tea, decaf coffee or tea, cola, chocolate.     DO NOT EAT OR DRINK THE MORNING OF YOUR TEST unless otherwise instructed. (You may have a couple sips of water.) If you are a diabetic, if insulin dependent: please take one third of your insulin with a light breakfast (two pieces of dry toast and a small juice). Bring insulin and medication with you to the test.     TAKE MORNING MEDICATIONS WITH A COUPLE SIPS OF WATER PRIOR TO TEST.     Hold all vitamins and supplements on the morning of your test.     Phone number provided for any additional questions or concerns.

## 2023-03-26 ENCOUNTER — Encounter: Admit: 2023-03-26 | Discharge: 2023-03-26 | Payer: MEDICARE

## 2023-03-26 ENCOUNTER — Ambulatory Visit: Admit: 2023-03-26 | Discharge: 2023-03-26 | Payer: MEDICARE

## 2023-03-26 DIAGNOSIS — I5032 Chronic diastolic (congestive) heart failure: Secondary | ICD-10-CM

## 2023-03-26 DIAGNOSIS — I272 Pulmonary hypertension, unspecified: Secondary | ICD-10-CM

## 2023-03-26 DIAGNOSIS — I10 Essential (primary) hypertension: Secondary | ICD-10-CM

## 2023-03-26 DIAGNOSIS — I482 Chronic atrial fibrillation, unspecified: Secondary | ICD-10-CM

## 2023-03-26 MED ORDER — RP DX TC-99M TETROFOSMIN MCI
29 | Freq: Once | INTRAVENOUS | 0 refills | Status: CP
Start: 2023-03-26 — End: ?

## 2023-03-26 MED ORDER — REGADENOSON 0.4 MG/5 ML IV SYRG
.4 mg | Freq: Once | INTRAVENOUS | 0 refills | Status: CP
Start: 2023-03-26 — End: ?

## 2023-03-26 MED ORDER — NITROGLYCERIN 0.4 MG SL SUBL
.4 mg | SUBLINGUAL | 0 refills | Status: AC | PRN
Start: 2023-03-26 — End: ?

## 2023-03-26 MED ORDER — ALBUTEROL SULFATE 90 MCG/ACTUATION IN HFAA
2 | RESPIRATORY_TRACT | 0 refills | Status: AC | PRN
Start: 2023-03-26 — End: ?

## 2023-03-26 MED ORDER — EUCALYPTUS-MENTHOL MM LOZG
1 | Freq: Once | ORAL | 0 refills | Status: AC | PRN
Start: 2023-03-26 — End: ?

## 2023-03-26 MED ORDER — AMINOPHYLLINE 25 MG/ML IV SOLN
50 mg | INTRAVENOUS | 0 refills | Status: AC | PRN
Start: 2023-03-26 — End: ?

## 2023-03-26 MED ORDER — RP DX TC-99M TETROFOSMIN MCI
11 | Freq: Once | INTRAVENOUS | 0 refills | Status: CP
Start: 2023-03-26 — End: ?

## 2023-03-26 MED ORDER — SODIUM CHLORIDE 0.9 % IV SOLP
250 mL | INTRAVENOUS | 0 refills | Status: AC | PRN
Start: 2023-03-26 — End: ?

## 2023-03-27 ENCOUNTER — Encounter: Admit: 2023-03-27 | Discharge: 2023-03-27 | Payer: MEDICARE

## 2023-03-27 NOTE — Telephone Encounter
Please let Ian Velez know that his stress test did not suggest any new blockages in his heart arteries.  There was evidence of blockage in the right coronary artery which was previously occluded and partially opened several years ago.  I think we continue with current plan for now.  How is he feeling?  Is his shortness of breath and lower extremity swelling any better?  Wt 260 lbs heaviness in chest gone shortness of breath better swelling better

## 2023-03-28 ENCOUNTER — Encounter: Admit: 2023-03-28 | Discharge: 2023-03-28 | Payer: MEDICARE

## 2023-03-28 DIAGNOSIS — I10 Essential (primary) hypertension: Secondary | ICD-10-CM

## 2023-03-28 DIAGNOSIS — I482 Chronic atrial fibrillation, unspecified: Secondary | ICD-10-CM

## 2023-03-28 DIAGNOSIS — I272 Pulmonary hypertension, unspecified: Secondary | ICD-10-CM

## 2023-03-28 DIAGNOSIS — I5032 Chronic diastolic (congestive) heart failure: Secondary | ICD-10-CM

## 2023-03-30 ENCOUNTER — Encounter: Admit: 2023-03-30 | Discharge: 2023-03-30 | Payer: MEDICARE

## 2023-04-02 ENCOUNTER — Encounter: Admit: 2023-04-02 | Discharge: 2023-04-02 | Payer: MEDICARE

## 2023-04-02 DIAGNOSIS — I519 Heart disease, unspecified: Secondary | ICD-10-CM

## 2023-04-02 MED ORDER — SPIRONOLACTONE 25 MG PO TAB
25 mg | ORAL_TABLET | Freq: Every day | ORAL | 3 refills | 90.00000 days | Status: AC
Start: 2023-04-02 — End: ?

## 2023-04-06 ENCOUNTER — Encounter: Admit: 2023-04-06 | Discharge: 2023-04-06 | Payer: MEDICARE

## 2023-04-06 DIAGNOSIS — R0902 Hypoxemia: Secondary | ICD-10-CM

## 2023-04-06 DIAGNOSIS — G4730 Sleep apnea, unspecified: Secondary | ICD-10-CM

## 2023-04-06 DIAGNOSIS — I739 Peripheral vascular disease, unspecified: Secondary | ICD-10-CM

## 2023-04-06 DIAGNOSIS — E785 Hyperlipidemia, unspecified: Secondary | ICD-10-CM

## 2023-04-06 DIAGNOSIS — J984 Other disorders of lung: Secondary | ICD-10-CM

## 2023-04-06 DIAGNOSIS — I2089 Chronic stable angina (HCC): Secondary | ICD-10-CM

## 2023-04-06 DIAGNOSIS — I10 Essential (primary) hypertension: Secondary | ICD-10-CM

## 2023-04-06 DIAGNOSIS — I2510 Atherosclerotic heart disease of native coronary artery without angina pectoris: Secondary | ICD-10-CM

## 2023-04-06 DIAGNOSIS — K219 Gastro-esophageal reflux disease without esophagitis: Secondary | ICD-10-CM

## 2023-04-06 DIAGNOSIS — E669 Obesity, unspecified: Secondary | ICD-10-CM

## 2023-04-06 DIAGNOSIS — M48061 Spinal stenosis, lumbar region without neurogenic claudication: Secondary | ICD-10-CM

## 2023-04-06 DIAGNOSIS — Z8551 Personal history of malignant neoplasm of bladder: Secondary | ICD-10-CM

## 2023-04-06 DIAGNOSIS — E782 Mixed hyperlipidemia: Secondary | ICD-10-CM

## 2023-04-06 DIAGNOSIS — I272 Pulmonary hypertension, unspecified: Secondary | ICD-10-CM

## 2023-04-06 DIAGNOSIS — I5030 Unspecified diastolic (congestive) heart failure: Secondary | ICD-10-CM

## 2023-04-06 DIAGNOSIS — Z9289 Personal history of other medical treatment: Secondary | ICD-10-CM

## 2023-04-06 DIAGNOSIS — B999 Unspecified infectious disease: Secondary | ICD-10-CM

## 2023-04-06 DIAGNOSIS — R0989 Other specified symptoms and signs involving the circulatory and respiratory systems: Secondary | ICD-10-CM

## 2023-04-06 DIAGNOSIS — M545 Low back pain: Secondary | ICD-10-CM

## 2023-04-06 DIAGNOSIS — F17210 Nicotine dependence, cigarettes, uncomplicated: Secondary | ICD-10-CM

## 2023-04-06 DIAGNOSIS — R972 Elevated prostate specific antigen [PSA]: Secondary | ICD-10-CM

## 2023-04-06 DIAGNOSIS — C679 Malignant neoplasm of bladder, unspecified: Secondary | ICD-10-CM

## 2023-04-06 DIAGNOSIS — C4490 Unspecified malignant neoplasm of skin, unspecified: Secondary | ICD-10-CM

## 2023-04-06 DIAGNOSIS — E119 Type 2 diabetes mellitus without complications: Secondary | ICD-10-CM

## 2023-04-06 DIAGNOSIS — Z7901 Long term (current) use of anticoagulants: Secondary | ICD-10-CM

## 2023-04-06 DIAGNOSIS — I4891 Unspecified atrial fibrillation: Secondary | ICD-10-CM

## 2023-04-06 DIAGNOSIS — G4733 Obstructive sleep apnea (adult) (pediatric): Secondary | ICD-10-CM

## 2023-04-06 DIAGNOSIS — M5106 Intervertebral disc disorders with myelopathy, lumbar region: Secondary | ICD-10-CM

## 2023-04-06 DIAGNOSIS — E7800 Pure hypercholesterolemia, unspecified: Secondary | ICD-10-CM

## 2023-04-06 DIAGNOSIS — Z9981 Dependence on supplemental oxygen: Secondary | ICD-10-CM

## 2023-04-06 DIAGNOSIS — C44310 Basal cell carcinoma of skin of unspecified parts of face: Secondary | ICD-10-CM

## 2023-04-06 DIAGNOSIS — I219 Acute myocardial infarction, unspecified: Secondary | ICD-10-CM

## 2023-04-06 DIAGNOSIS — M4307 Spondylolysis, lumbosacral region: Secondary | ICD-10-CM

## 2023-04-06 DIAGNOSIS — Z9229 Personal history of other drug therapy: Secondary | ICD-10-CM

## 2023-04-06 DIAGNOSIS — J449 Chronic obstructive pulmonary disease, unspecified: Secondary | ICD-10-CM

## 2023-04-06 DIAGNOSIS — I200 Unstable angina: Secondary | ICD-10-CM

## 2023-04-06 DIAGNOSIS — M1990 Unspecified osteoarthritis, unspecified site: Secondary | ICD-10-CM

## 2023-04-06 DIAGNOSIS — I70245 Atherosclerosis of native arteries of left leg with ulceration of other part of foot: Secondary | ICD-10-CM

## 2023-04-06 DIAGNOSIS — Z87891 Personal history of nicotine dependence: Secondary | ICD-10-CM

## 2023-04-06 MED ORDER — IMS MIXTURE TEMPLATE
60 mg | Freq: Two times a day (BID) | ORAL | 0 refills
Start: 2023-04-06 — End: ?

## 2023-04-06 MED ORDER — FAMOTIDINE 20 MG PO TAB
20 mg | Freq: Once | ORAL | 0 refills
Start: 2023-04-06 — End: ?

## 2023-04-06 MED ORDER — ASPIRIN 325 MG PO TAB
325 mg | Freq: Once | ORAL | 0 refills
Start: 2023-04-06 — End: ?

## 2023-04-06 MED ORDER — DIPHENHYDRAMINE HCL 50 MG PO CAP
50 mg | Freq: Once | ORAL | 0 refills
Start: 2023-04-06 — End: ?

## 2023-04-10 ENCOUNTER — Encounter: Admit: 2023-04-10 | Discharge: 2023-04-10 | Payer: MEDICARE

## 2023-04-10 ENCOUNTER — Inpatient Hospital Stay: Admit: 2023-04-10 | Payer: MEDICARE

## 2023-04-10 ENCOUNTER — Inpatient Hospital Stay: Admit: 2023-04-10 | Discharge: 2023-04-10 | Payer: MEDICARE

## 2023-04-10 DIAGNOSIS — I5033 Acute on chronic diastolic (congestive) heart failure: Secondary | ICD-10-CM

## 2023-04-10 MED ORDER — RIVAROXABAN 20 MG PO TAB
20 mg | Freq: Every day | ORAL | 0 refills | Status: AC
Start: 2023-04-10 — End: ?
  Administered 2023-04-12 – 2023-04-15 (×4): 20 mg via ORAL

## 2023-04-10 MED ORDER — BUDESONIDE-FORMOTEROL 80-4.5 MCG/ACTUATION IN HFAA
2 | Freq: Two times a day (BID) | RESPIRATORY_TRACT | 0 refills | Status: AC
Start: 2023-04-10 — End: ?
  Administered 2023-04-11: 15:00:00 2 via RESPIRATORY_TRACT

## 2023-04-10 MED ORDER — ONDANSETRON HCL (PF) 4 MG/2 ML IJ SOLN
4 mg | INTRAVENOUS | 0 refills | Status: AC | PRN
Start: 2023-04-10 — End: ?
  Administered 2023-04-14: 04:00:00 4 mg via INTRAVENOUS

## 2023-04-10 MED ORDER — DOXYCYCLINE HYCLATE 100 MG PO TAB
100 mg | Freq: Two times a day (BID) | ORAL | 0 refills | Status: AC
Start: 2023-04-10 — End: ?
  Administered 2023-04-11 – 2023-04-15 (×9): 100 mg via ORAL

## 2023-04-10 MED ORDER — SPIRONOLACTONE 25 MG PO TAB
25 mg | Freq: Every day | ORAL | 0 refills | Status: AC
Start: 2023-04-10 — End: ?
  Administered 2023-04-11 – 2023-04-15 (×5): 25 mg via ORAL

## 2023-04-10 MED ORDER — INSULIN NPH AND REGULAR HUMAN 100 UNIT/ML (70-30) SC INPN
40 [IU] | Freq: Two times a day (BID) | SUBCUTANEOUS | 0 refills | Status: AC
Start: 2023-04-10 — End: ?
  Administered 2023-04-11: 04:00:00 40 [IU] via SUBCUTANEOUS

## 2023-04-10 MED ORDER — SENNOSIDES-DOCUSATE SODIUM 8.6-50 MG PO TAB
1 | Freq: Every day | ORAL | 0 refills | Status: AC | PRN
Start: 2023-04-10 — End: ?

## 2023-04-10 MED ORDER — POTASSIUM CHLORIDE 20 MEQ PO TBTQ
20 meq | Freq: Two times a day (BID) | ORAL | 0 refills | Status: AC
Start: 2023-04-10 — End: ?
  Administered 2023-04-11 (×3): 20 meq via ORAL

## 2023-04-10 MED ORDER — DAPAGLIFLOZIN PROPANEDIOL 10 MG PO TAB
10 mg | Freq: Every day | ORAL | 0 refills | Status: AC
Start: 2023-04-10 — End: ?
  Administered 2023-04-12 – 2023-04-15 (×4): 10 mg via ORAL

## 2023-04-10 MED ORDER — POLYETHYLENE GLYCOL 3350 17 GRAM PO PWPK
1 | Freq: Every day | ORAL | 0 refills | Status: AC | PRN
Start: 2023-04-10 — End: ?
  Administered 2023-04-11 – 2023-04-13 (×4): 17 g via ORAL

## 2023-04-10 MED ORDER — TIOTROPIUM BROMIDE 2.5 MCG/ACTUATION IN MIST
2 | Freq: Every day | RESPIRATORY_TRACT | 0 refills | Status: AC
Start: 2023-04-10 — End: ?
  Administered 2023-04-11: 15:00:00 2 via RESPIRATORY_TRACT

## 2023-04-10 MED ORDER — OXYCODONE 5 MG PO TAB
5 mg | ORAL | 0 refills | Status: AC | PRN
Start: 2023-04-10 — End: ?
  Administered 2023-04-14 – 2023-04-15 (×3): 5 mg via ORAL

## 2023-04-10 MED ORDER — ISOSORBIDE MONONITRATE 30 MG PO TB24
30 mg | Freq: Every morning | ORAL | 0 refills | Status: AC
Start: 2023-04-10 — End: ?
  Administered 2023-04-11 – 2023-04-15 (×5): 30 mg via ORAL

## 2023-04-10 MED ORDER — CYANOCOBALAMIN (VITAMIN B-12) 500 MCG PO TAB
1000 ug | Freq: Every day | ORAL | 0 refills | Status: AC
Start: 2023-04-10 — End: ?
  Administered 2023-04-11 – 2023-04-15 (×5): 1000 ug via ORAL

## 2023-04-10 MED ORDER — ACETAMINOPHEN 325 MG PO TAB
650 mg | ORAL | 0 refills | Status: AC | PRN
Start: 2023-04-10 — End: ?
  Administered 2023-04-13: 03:00:00 650 mg via ORAL

## 2023-04-10 MED ORDER — ASPIRIN 81 MG PO TBEC
81 mg | Freq: Every day | ORAL | 0 refills | Status: AC
Start: 2023-04-10 — End: ?
  Administered 2023-04-11 – 2023-04-15 (×5): 81 mg via ORAL

## 2023-04-10 MED ORDER — DEXTROSE 50 % IN WATER (D50W) IV SYRG
12.5-25 g | INTRAVENOUS | 0 refills | Status: AC | PRN
Start: 2023-04-10 — End: ?

## 2023-04-10 MED ORDER — ATORVASTATIN 40 MG PO TAB
80 mg | Freq: Every day | ORAL | 0 refills | Status: AC
Start: 2023-04-10 — End: ?
  Administered 2023-04-11 – 2023-04-15 (×5): 80 mg via ORAL

## 2023-04-10 MED ORDER — MONTELUKAST 10 MG PO TAB
10 mg | Freq: Every evening | ORAL | 0 refills | Status: AC
Start: 2023-04-10 — End: ?
  Administered 2023-04-11 – 2023-04-15 (×5): 10 mg via ORAL

## 2023-04-10 MED ORDER — ONDANSETRON 4 MG PO TBDI
4 mg | ORAL | 0 refills | Status: AC | PRN
Start: 2023-04-10 — End: ?

## 2023-04-10 MED ORDER — MELATONIN 5 MG PO TAB
5 mg | Freq: Every evening | ORAL | 0 refills | Status: AC | PRN
Start: 2023-04-10 — End: ?

## 2023-04-10 MED ORDER — BUDESONIDE-FORMOTEROL 80-4.5 MCG/ACTUATION IN HFAA
2 | Freq: Every day | RESPIRATORY_TRACT | 0 refills | Status: DC
Start: 2023-04-10 — End: 2023-04-11

## 2023-04-10 MED ORDER — METOPROLOL SUCCINATE 50 MG PO TB24
50 mg | Freq: Two times a day (BID) | ORAL | 0 refills | Status: AC
Start: 2023-04-10 — End: ?
  Administered 2023-04-11 (×2): 50 mg via ORAL

## 2023-04-10 MED ORDER — FUROSEMIDE 10 MG/ML IJ SOLN
40 mg | Freq: Once | INTRAVENOUS | 0 refills | Status: DC
Start: 2023-04-10 — End: 2023-04-11

## 2023-04-10 MED ORDER — POTASSIUM CHLORIDE 20 MEQ PO TBTQ
20 meq | Freq: Three times a day (TID) | ORAL | 0 refills | Status: DC
Start: 2023-04-10 — End: 2023-04-11

## 2023-04-10 MED ORDER — INSULIN ASPART 100 UNIT/ML SC FLEXPEN
0-12 [IU] | Freq: Before meals | SUBCUTANEOUS | 0 refills | Status: AC
Start: 2023-04-10 — End: ?
  Administered 2023-04-12: 02:00:00 4 [IU] via SUBCUTANEOUS

## 2023-04-10 MED ORDER — FUROSEMIDE 10 MG/ML IJ SOLN
80 mg | Freq: Two times a day (BID) | INTRAVENOUS | 0 refills | Status: AC
Start: 2023-04-10 — End: ?

## 2023-04-10 NOTE — Care Coordination-Inpatient
Dr. Sandria Manly cardiology patient, admit for heart failure with severe COPD, HF consult, right heart cath and IV diuresis. Bladder cancer as well and needs optimized prior

## 2023-04-11 ENCOUNTER — Inpatient Hospital Stay: Admit: 2023-04-11 | Discharge: 2023-04-11 | Payer: MEDICARE

## 2023-04-11 ENCOUNTER — Encounter: Admit: 2023-04-11 | Discharge: 2023-04-11 | Payer: MEDICARE

## 2023-04-11 ENCOUNTER — Inpatient Hospital Stay: Admit: 2023-04-11 | Discharge: 2023-04-10 | Payer: MEDICARE

## 2023-04-11 LAB — COMPREHENSIVE METABOLIC PANEL
ALBUMIN: 4.1 g/dL (ref 3.5–5.0)
ALK PHOSPHATASE: 68 U/L (ref 25–110)
ALT: 27 U/L (ref 7–56)
ANION GAP: 14 10*3/uL — ABNORMAL HIGH (ref 3–12)
AST: 41 U/L — ABNORMAL HIGH (ref 7–40)
BLD UREA NITROGEN: 17 mg/dL (ref 7–25)
CALCIUM: 9.1 mg/dL — ABNORMAL HIGH (ref 8.5–10.6)
CO2: 26 MMOL/L (ref 21–30)
CREATININE: 1.1 mg/dL (ref 0.4–1.24)
EGFR: >60 mL/min (ref >60)
SODIUM: 138 MMOL/L (ref 137–147)
TOTAL BILIRUBIN: 2 mg/dL — ABNORMAL HIGH (ref 0.2–1.3)
TOTAL PROTEIN: 7.2 g/dL (ref 6.0–8.0)

## 2023-04-11 LAB — CBC AND DIFF
ABSOLUTE BASO COUNT: 0 10*3/uL (ref 0–0.20)
ABSOLUTE EOS COUNT: 0.1 10*3/uL (ref 0–0.45)
ABSOLUTE MONO COUNT: 0.6 10*3/uL (ref 0–0.80)
WBC COUNT: 8.6 10*3/uL (ref 4.5–11.0)

## 2023-04-11 LAB — PHOSPHORUS: PHOSPHORUS: 3.8 mg/dL (ref 2.0–4.5)

## 2023-04-11 LAB — TSH WITH FREE T4 REFLEX: TSH: 2.2 uU/mL — ABNORMAL HIGH (ref 0.35–5.00)

## 2023-04-11 LAB — MAGNESIUM: MAGNESIUM: 2.3 mg/dL (ref 1.6–2.6)

## 2023-04-11 LAB — POC GLUCOSE: POC GLUCOSE: 220 mg/dL — ABNORMAL HIGH (ref 70–100)

## 2023-04-11 MED ADMIN — SODIUM CHLORIDE 0.9 % IV SOLP [27838]: 25 mg | INTRAVENOUS | Stop: 2023-04-12 | NDC 00338004931

## 2023-04-11 MED ADMIN — SODIUM CHLORIDE 0.9 % IJ SOLN [7319]: 10 mL | INTRAVENOUS | @ 17:00:00 | Stop: 2023-04-11 | NDC 00409488820

## 2023-04-11 MED ADMIN — TORSEMIDE 20 MG PO TAB [18293]: 60 mg | ORAL | @ 17:00:00 | NDC 68084053911

## 2023-04-11 MED ADMIN — TORSEMIDE 20 MG PO TAB [18293]: 60 mg | ORAL | NDC 68084053911

## 2023-04-11 MED ADMIN — IRON DEXTRAN 50 MG/ML IJ SOLN [336454]: 25 mg | INTRAVENOUS | Stop: 2023-04-12 | NDC 00023608201

## 2023-04-11 MED ADMIN — PERFLUTREN LIPID MICROSPHERES 1.1 MG/ML IV SUSP [79178]: 3 mL | INTRAVENOUS | @ 17:00:00 | Stop: 2023-04-11 | NDC 11994001116

## 2023-04-11 NOTE — H&P (View-Only)
Admission History and Physical Examination      Name: Ian Velez   MRN: [id number]     DOB: 02/10/1941      Age: 82 y.o.  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    Acute on chronic diastolic heart failure  - Direct admission from cardiology clinic for right heart catheterization and cardiac optimization prior to pursuing possible surgical intervention on recurrence of bladder cancer  - Recent TTE on 8/16 with a preserved EF although with grade 3 diastolic dysfunction  - Appears to be relatively euvolemic on admission with 1+ lower extremity edema that patient and family report is much improved over the last week  Plan:  - Heart failure consulted, planning for right heart catheterization tomorrow 10/9  - Check x-ray, NT proBNP  - Holding PTA p.o. torsemide 60 mg twice daily and will transition IV Lasix 80 mg twice daily for the time being  - Continue PTA spironolactone 25 mg daily  - Continue metoprolol succinate 50 mg daily  - Continue PTA potassium 20 mill equivalents twice daily  - N.p.o. at midnight    Chronic hypoxic respiratory failure  COPD  On baseline 3 L via nasal cannula on admission.  States that his dyspnea is at his baseline.  - Continue PTA Spiriva, Symbicort, montelukast  - DuoNebs ordered as needed    Permanent atrial fibrillation  Rates in the 100s to 120s on admission but otherwise hemodynamically stable  - Continue PTA metoprolol succinate 50 mg daily  - Continue PTA Xarelto 20 mg nightly    Type 2 diabetes mellitus  PTA regimen includes Farxiga, insulin aspart 70/30 80 units twice daily, metformin 500 mg twice daily.  - Check A1c  - Decrease PTA insulin 70/30 to 40 units twice daily  - Mid dose correction factor ordered    History of recurrent lower extremity cellulitis  - Continue PTA doxycycline 100 mg twice daily    Coronary artery disease  - Continue PTA aspirin, statin, Imdur    Diet: Cardiac, n.p.o. midnight  DVT prophylaxis: PTA Xarelto  CODE STATUS: Full (confirmed on 10/8)  Disposition: Inpatient medicine    Lenore Manner, MD  Hospitalist, Internal Medicine   Available via Voalte  Pager: [phone]    Total Time Today was >75 minutes in the following activities: Preparing to see the patient, Obtaining and/or reviewing separately obtained history, Performing a medically appropriate examination and/or evaluation, Counseling and educating the patient/family/caregiver, Ordering medications, tests, or procedures, Referring and communication with other health care professionals (when not separately reported), and  Documenting clinical information in the electronic or other health record      _____________________________________________________________________________    Primary Care Physician: Emerson Monte  Verified    Chief Complaint: Shortness of breath  History of Present Illness: Ian Velez is a 82 y.o. 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    Patient states that he was recently seen by his cardiologist Dr. Sandria Manly within the last few weeks.  During this visit, he reported ongoing persistent shortness of breath and dyspnea with exertion as well as unfortunately his bladder cancer is recurred and they are considering surgical intervention.  In effort to ensure cardiac optimization, patient's PTA diuretic was increased to torsemide 60 mg twice daily and plans made to directly admit for right heart catheterization on 10/9.  Patient otherwise feels at his baseline health which is with chronic shortness of breath and dyspnea with exertion.  His family states that he is lost 10 pounds in the last week with the increase of his torsemide.  All other review of systems are negative, no further questions or concerns at this time.    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     DM (diabetes mellitus), type 2 (HCC)     Elevated PSA     Facial basal cell cancer     Former cigarette smoker     High cholesterol     History of bladder carcinoma     History of blood transfusion     HX: anticoagulation     Hyperlipidemia     Hypertension     Hypoxemia     Infection     Cellulitis left leg    Low back pain     Lumbosacral spondylolysis     Lung disease     COPD    Myocardial infarction (HCC)     OA (osteoarthritis)     Obesity     Obesity, Class III, BMI 40-49.9 (morbid obesity) (HCC)     Obstructive sleep apnea     On home oxygen therapy     3L    Peripheral artery disease (HCC)     Pulmonary hypertension (HCC)     Skin cancer     Sleep apnea     Spinal stenosis of lumbar region     Stage 3 severe COPD by GOLD classification (HCC)     Unstable angina [facility]      Surgical History:   Procedure Laterality Date    LEG SURGERY  07/04/1975    left leg rapri    COLONOSCOPY  2008    HX SKIN RESECTION  2009    basal cell ca removed of left cheek    CARDIAC CATHERIZATION  08/24/2017    ANGIOGRAPHY CORONARY ARTERY WITH LEFT HEART CATHETERIZATION N/A 11/07/2017    Performed by Greig Castilla, MD at [facility] CATH LAB    POSSIBLE PERCUTANEOUS CORONARY STENT PLACEMENT WITH ANGIOPLASTY N/A 11/07/2017    Performed by Greig Castilla, MD at [facility] CATH LAB    HX ADENOIDECTOMY      HX CORONARY STENT PLACEMENT  11/07/2017    HX CYSTECTOMY      PARTIAL CYSTECTOMY    HX TONSILLECTOMY      HX VASECTOMY      ROTATOR CUFF REPAIR Left     TONSILLECTOMY      WISDOM TEETH EXTRACTION       Family History   Problem Relation Name Age of Onset    Heart Failure Mother Verdon Cummins     Diabetes Mother Verdon Cummins     Heart Attack Father      Heart Attack Sister       Social History     Tobacco Use    Smoking status: Former     Current packs/day: 0.00     Average packs/day: 2.0 packs/day for 50.0 years (100.0 ttl pk-yrs)     Types: Cigarettes     Start date: 07/12/1958     Quit date: 07/12/2008     Years since quitting: 14.7    Smokeless tobacco: Never   Substance and Sexual Activity    Alcohol use: Not Currently     Alcohol/week: 4.0 standard drinks of alcohol     Types: 4 Cans of beer per week     Comment: per week    Drug use: Never    Sexual activity: Not Currently             Immunizations (includes history and patient reported):   Immunization History   Administered Date(s) Administered    COVID-19 (MODERNA), mRNA vacc, 100 mcg/0.5 mL (PF) 08/20/2019, 09/17/2019, 05/12/2020    Covid-19 Bivalent (25yr+)(MODERNA), mRNA Vacc, 50 mcg/0.5 mL (PF) 04/12/2021    Covid-19 mRNA Vaccine >=12yo (Moderna)(Spikevax) 06/19/2022, 04/04/2023           Allergies:  Iodinated contrast media and Telepaque [iopanoic acid]    Medications:  Medications Prior to Admission   Medication Sig    acetaminophen (TYLENOL) 325 mg tablet Take two tablets by mouth every 6 hours as needed for Pain.    albuterol 0.5% (PROVENTIL; VENTOLIN) 2.5 mg/0.5 mL nebulizer solution Inhale one each solution by nebulizer as directed four times daily as needed for Shortness of Breath or Wheezing.    albuterol sulfate (PROAIR HFA) 90 mcg/actuation HFA aerosol inhaler Inhale two puffs by mouth into the lungs as Needed for Wheezing or Shortness of Breath.    aspirin EC (ASPIR-LOW) 81 mg tablet Take one tablet by mouth daily.    atorvastatin (LIPITOR) 80 mg tablet TAKE ONE TABLET BY MOUTH DAILY    cyanocobalamin (vitamin B-12) 500 mcg tablet Take two tablets by mouth daily.    dapagliflozin propanediol (FARXIGA) 10 mg tablet Take one tablet by mouth daily.    doxycycline hyclate (VIBRACIN) 100 mg tablet Take one tablet by mouth twice daily.    ELDERBERRY FRUIT PO Take 2 tablets by mouth daily.    HYDROcodone/acetaminophen (NORCO) 5/325 mg tablet Take one tablet by mouth as Needed for Pain.    hydrocortisone (HYTONE) 2.5 % topical cream APPLY TOPICALLY TO THE FACE TWICE DAILY FOR 1 TO  2 WEEKS AS NEEDED FLARES MIX IN THE HAND WITHH KETOCONAZOLE CREAM    insulin aspart 70/30 (+) (NOVOLOG MIX 70/30) 100 unit/mL (70-30) Inject eighty Units under the skin twice daily.    isosorbide mononitrate ER (IMDUR) 30 mg tablet,extended release 24 hr Take one tablet by mouth every morning.    magnesium oxide (PHILLIPS) 500 mg magnesium tablet Take one tablet by mouth at bedtime daily.    metFORMIN (GLUCOPHAGE) 500 mg tablet Take one tablet by mouth twice daily with meals. Hold 48 hours after cath procedure.  Resume with evening dose on 11/09/17    metoprolol succinate XL (TOPROL XL) 50 mg extended release tablet TAKE ONE TABLET BY MOUTH TWICE DAILY    montelukast (SINGULAIR) 10 mg tablet Take one tablet by mouth at bedtime daily.    MULTIVITAMIN PO Take 1 tablet by mouth daily.    nitroglycerin (NITROSTAT) 0.4 mg tablet Place one tablet under tongue every 5 minutes as needed for Chest Pain. Max of 3 tablets, call 911.    potassium chloride SR (K-DUR) 20 mEq tablet Take one tablet by mouth three times daily. Take with a meal and a full glass of water.    rivaroxaban (XARELTO) 20 mg tablet Take one tablet by mouth daily. Take with food.    spironolactone (ALDACTONE) 25 mg tablet TAKE 1 TABLET BY MOUTH EVERY DAY, TAKE WITH FOOD    SYMBICORT 80-4.5 mcg/actuation aerosol inhaler Inhale 2 PUFFS IN THE morning AND AT bedtime. RINSE MOUTH AND THROAT AFTER USE TO REDUCE AFTERTASTE AND INCIDENCE OF CANDIDIASIS. DO NOT SWALLOW MOUTH WITH WATER AFTER USE.    tiotropium bromide (SPIRIVA) 18 mcg capsule for inhaler Place one capsule into inhaler and inhale into lungs as directed daily.    torsemide (DEMADEX) 20 mg tablet Take 60mg  twice daily for 7 days, then return to 60mg  daily as directed.     Review of Systems:  All other systems reviewed and are negative.    Physical Exam:  Vital Signs: Last Filed In 24 Hours Vital Signs: 24 Hour Range   BP: 131/68 (10/08 1852)  Temp: 36.4 ?C (97.6 ?F) (10/08 1852)  Pulse: 121 (10/08 1852)  Respirations: 20 PER MINUTE (10/08 1852)  SpO2: 92 % (10/08 1852)  O2 Device: Nasal cannula (10/08 1852)  O2 Liter Flow: 3 Lpm (10/08 1852)  Height: 167.6 cm (5' 6) (10/08 1852) BP: (131)/(68)   Temp:  [36.4 ?C (97.6 ?F)]   Pulse:  [121]   Respirations:  [20 PER MINUTE]   SpO2:  [92 %]   O2 Device: Nasal cannula  O2 Liter Flow: 3 Lpm          General: Sickly appearing, lying in bed no acute distress  Head: normocephalic atraumatic    Eyes: pupils equal and reactive to light, external ocular movement intact, sclerae anicteric   ENT: oropharynx clear without exudate or erythema, nares patent bilaterally   CV: Tachycardic rate, irregularly irregular rhythm, no murmurs  Respiratory: Decreased breath sounds noted bilaterally, no crackles wheezes or rales  Abdomen: soft, moderately distended, nontender to palpation  Skin: warm, dry, no rashes   Extremities: Chronic venous stasis skin changes noted, trace to 1+ pitting edema in bilateral lower extremities  Neuro: alert & oriented x3, no focal deficits appreciated   Psych: euthymic mood, appropriate affect     Lab/Radiology/Other Diagnostic Tests:  24-hour labs:    Results for orders placed or performed during the hospital encounter of 04/10/23 (from the past 24 hour(s))  CBC AND DIFF    Collection Time: 04/10/23  8:05 PM   Result Value Ref Range    White Blood Cells 8.6 4.5 - 11.0 K/UL    RBC 4.89 4.4 - 5.5 M/UL    Hemoglobin 15.7 13.5 - 16.5 GM/DL    Hematocrit 86.5 40 - 50 %    MCV 94.8 80 - 100 FL    MCH 32.0 26 - 34 PG    MCHC 33.8 32.0 - 36.0 G/DL    RDW 78.4 (H) 11 - 15 %    Platelet Count 220 150 - 400 K/UL    MPV 8.4 7 - 11 FL    Neutrophils 52 41 - 77 %    Lymphocytes 37 24 - 44 %    Monocytes 8 4 - 12 %    Eosinophils 2 0 - 5 %    Basophils 1 0 - 2 %    Absolute Neutrophil Count 4.53 1.8 - 7.0 K/UL    Absolute Lymph Count 3.19 1.0 - 4.8 K/UL    Absolute Monocyte Count 0.68 0 - 0.80 K/UL    Absolute Eosinophil Count 0.18 0 - 0.45 K/UL    Absolute Basophil Count 0.06 0 - 0.20 K/UL   COMPREHENSIVE METABOLIC PANEL    Collection Time: 04/10/23  8:05 PM   Result Value Ref Range    Sodium 138 137 - 147 MMOL/L    Potassium 4.3 3.5 - 5.1 MMOL/L    Chloride 98 98 - 110 MMOL/L    Glucose 263 (H) 70 - 100 MG/DL    Blood Urea Nitrogen 17 7 - 25 MG/DL    Creatinine 6.96 0.4 - 1.24 MG/DL    Calcium 9.1 8.5 - 29.5 MG/DL    Total Protein 7.2 6.0 - 8.0 G/DL    Total Bilirubin 2.0 (H) 0.2 - 1.3 MG/DL    Albumin 4.1 3.5 - 5.0 G/DL    Alk Phosphatase 68 25 - 110 U/L    AST (SGOT) 41 (H) 7 - 40 U/L    CO2 26 21 - 30 MMOL/L    ALT (SGPT) 27 7 - 56 U/L    Anion Gap 14 (H) 3 - 12    eGFR >60 >60 mL/min   MAGNESIUM    Collection Time: 04/10/23  8:05 PM   Result Value Ref Range    Magnesium 2.3 1.6 - 2.6 mg/dL   PHOSPHORUS    Collection Time: 04/10/23  8:05 PM   Result Value Ref Range    Phosphorus 3.8 2.0 - 4.5 MG/DL   TSH WITH FREE T4 REFLEX    Collection Time: 04/10/23  8:05 PM   Result Value Ref Range    TSH 2.20 0.35 - 5.00 MCU/ML   POC GLUCOSE    Collection Time: 04/10/23 10:06 PM   Result Value Ref Range    Glucose, POC 220 (H) 70 - 100 MG/DL        Pertinent radiology reviewed.

## 2023-04-12 ENCOUNTER — Encounter: Admit: 2023-04-12 | Discharge: 2023-04-12 | Payer: MEDICARE

## 2023-04-12 DIAGNOSIS — I739 Peripheral vascular disease, unspecified: Secondary | ICD-10-CM

## 2023-04-12 DIAGNOSIS — E669 Obesity, unspecified: Secondary | ICD-10-CM

## 2023-04-12 DIAGNOSIS — I10 Essential (primary) hypertension: Secondary | ICD-10-CM

## 2023-04-12 DIAGNOSIS — Z9229 Personal history of other drug therapy: Secondary | ICD-10-CM

## 2023-04-12 DIAGNOSIS — G4733 Obstructive sleep apnea (adult) (pediatric): Secondary | ICD-10-CM

## 2023-04-12 DIAGNOSIS — Z8551 Personal history of malignant neoplasm of bladder: Secondary | ICD-10-CM

## 2023-04-12 DIAGNOSIS — Z87891 Personal history of nicotine dependence: Secondary | ICD-10-CM

## 2023-04-12 DIAGNOSIS — C679 Malignant neoplasm of bladder, unspecified: Secondary | ICD-10-CM

## 2023-04-12 DIAGNOSIS — M48061 Spinal stenosis, lumbar region without neurogenic claudication: Secondary | ICD-10-CM

## 2023-04-12 DIAGNOSIS — J449 Chronic obstructive pulmonary disease, unspecified: Secondary | ICD-10-CM

## 2023-04-12 DIAGNOSIS — C4490 Unspecified malignant neoplasm of skin, unspecified: Secondary | ICD-10-CM

## 2023-04-12 DIAGNOSIS — E782 Mixed hyperlipidemia: Secondary | ICD-10-CM

## 2023-04-12 DIAGNOSIS — I219 Acute myocardial infarction, unspecified: Secondary | ICD-10-CM

## 2023-04-12 DIAGNOSIS — M5106 Intervertebral disc disorders with myelopathy, lumbar region: Secondary | ICD-10-CM

## 2023-04-12 DIAGNOSIS — M4307 Spondylolysis, lumbosacral region: Secondary | ICD-10-CM

## 2023-04-12 DIAGNOSIS — G4730 Sleep apnea, unspecified: Secondary | ICD-10-CM

## 2023-04-12 DIAGNOSIS — I70245 Atherosclerosis of native arteries of left leg with ulceration of other part of foot: Secondary | ICD-10-CM

## 2023-04-12 DIAGNOSIS — I272 Pulmonary hypertension, unspecified: Secondary | ICD-10-CM

## 2023-04-12 DIAGNOSIS — I4891 Unspecified atrial fibrillation: Secondary | ICD-10-CM

## 2023-04-12 DIAGNOSIS — R0989 Other specified symptoms and signs involving the circulatory and respiratory systems: Secondary | ICD-10-CM

## 2023-04-12 DIAGNOSIS — B999 Unspecified infectious disease: Secondary | ICD-10-CM

## 2023-04-12 DIAGNOSIS — R972 Elevated prostate specific antigen [PSA]: Secondary | ICD-10-CM

## 2023-04-12 DIAGNOSIS — E7800 Pure hypercholesterolemia, unspecified: Secondary | ICD-10-CM

## 2023-04-12 DIAGNOSIS — K219 Gastro-esophageal reflux disease without esophagitis: Secondary | ICD-10-CM

## 2023-04-12 DIAGNOSIS — Z7901 Long term (current) use of anticoagulants: Secondary | ICD-10-CM

## 2023-04-12 DIAGNOSIS — Z9289 Personal history of other medical treatment: Secondary | ICD-10-CM

## 2023-04-12 DIAGNOSIS — C44310 Basal cell carcinoma of skin of unspecified parts of face: Secondary | ICD-10-CM

## 2023-04-12 DIAGNOSIS — I2089 Chronic stable angina (HCC): Secondary | ICD-10-CM

## 2023-04-12 DIAGNOSIS — Z9981 Dependence on supplemental oxygen: Secondary | ICD-10-CM

## 2023-04-12 DIAGNOSIS — E119 Type 2 diabetes mellitus without complications: Secondary | ICD-10-CM

## 2023-04-12 DIAGNOSIS — R0902 Hypoxemia: Secondary | ICD-10-CM

## 2023-04-12 DIAGNOSIS — M545 Low back pain: Secondary | ICD-10-CM

## 2023-04-12 DIAGNOSIS — F17210 Nicotine dependence, cigarettes, uncomplicated: Secondary | ICD-10-CM

## 2023-04-12 DIAGNOSIS — J984 Other disorders of lung: Secondary | ICD-10-CM

## 2023-04-12 DIAGNOSIS — I200 Unstable angina: Secondary | ICD-10-CM

## 2023-04-12 DIAGNOSIS — I2510 Atherosclerotic heart disease of native coronary artery without angina pectoris: Secondary | ICD-10-CM

## 2023-04-12 DIAGNOSIS — E785 Hyperlipidemia, unspecified: Secondary | ICD-10-CM

## 2023-04-12 DIAGNOSIS — M1990 Unspecified osteoarthritis, unspecified site: Secondary | ICD-10-CM

## 2023-04-12 MED ADMIN — POTASSIUM CHLORIDE 20 MEQ PO TBTQ [35943]: 40 meq | ORAL | @ 21:00:00 | NDC 00832532510

## 2023-04-12 MED ADMIN — POTASSIUM CHLORIDE 20 MEQ PO TBTQ [35943]: 40 meq | ORAL | @ 13:00:00 | NDC 00832532510

## 2023-04-12 MED ADMIN — TORSEMIDE 20 MG PO TAB [18293]: 60 mg | ORAL | @ 13:00:00 | Stop: 2023-04-12 | NDC 68084053911

## 2023-04-12 MED ADMIN — IRON DEXTRAN 50 MG/ML IJ SOLN [336454]: 1000 mg | INTRAVENOUS | @ 01:00:00 | Stop: 2023-04-12 | NDC 00023608201

## 2023-04-12 MED ADMIN — SODIUM CHLORIDE 0.9 % IV SOLP [27838]: 1000 mg | INTRAVENOUS | @ 01:00:00 | Stop: 2023-04-12 | NDC 00338004902

## 2023-04-12 MED ADMIN — METOPROLOL SUCCINATE 25 MG PO TB24 [81866]: 25 mg | ORAL | @ 13:00:00 | Stop: 2023-04-12 | NDC 00904632261

## 2023-04-12 MED ADMIN — BUMETANIDE 0.25 MG/ML IJ SOLN [9308]: 4 mg | INTRAVENOUS | @ 21:00:00 | NDC 72205010101

## 2023-04-13 ENCOUNTER — Encounter: Admit: 2023-04-13 | Discharge: 2023-04-13 | Payer: MEDICARE

## 2023-04-13 MED ADMIN — CHLOROTHIAZIDE SODIUM 500 MG IV SOLR [81318]: 500 mg | INTRAVENOUS | @ 21:00:00 | Stop: 2023-04-13 | NDC 25021030520

## 2023-04-13 MED ADMIN — INSULIN ASPART 100 UNIT/ML SC FLEXPEN [87504]: 12 [IU] | SUBCUTANEOUS | @ 14:00:00 | NDC 00169633910

## 2023-04-13 MED ADMIN — METOPROLOL SUCCINATE 50 MG PO TB24 [77931]: 50 mg | ORAL | @ 01:00:00 | NDC 00904632361

## 2023-04-13 MED ADMIN — POTASSIUM CHLORIDE 20 MEQ PO TBTQ [35943]: 60 meq | ORAL | @ 15:00:00 | NDC 00832532510

## 2023-04-13 MED ADMIN — POTASSIUM CHLORIDE 20 MEQ PO TBTQ [35943]: 60 meq | ORAL | @ 23:00:00 | NDC 00832532510

## 2023-04-13 MED ADMIN — METOPROLOL SUCCINATE 50 MG PO TB24 [77931]: 50 mg | ORAL | @ 15:00:00 | NDC 00904632361

## 2023-04-13 MED ADMIN — BUMETANIDE 0.25 MG/ML IJ SOLN [9308]: 4 mg | INTRAVENOUS | @ 23:00:00 | NDC 72205010101

## 2023-04-13 MED ADMIN — WATER FOR INJECTION, STERILE IJ SOLN [79513]: 18 mL | INTRAVENOUS | @ 21:00:00 | Stop: 2023-04-13 | NDC 00409488723

## 2023-04-13 MED ADMIN — BUMETANIDE 0.25 MG/ML IJ SOLN [9308]: 4 mg | INTRAVENOUS | @ 15:00:00 | NDC 72205010101

## 2023-04-14 MED ADMIN — BUMETANIDE 0.25 MG/ML IJ SOLN [9308]: 4 mg | INTRAVENOUS | @ 22:00:00 | NDC 72205010101

## 2023-04-14 MED ADMIN — CYCLOBENZAPRINE 10 MG PO TAB [2017]: 10 mg | ORAL | @ 04:00:00 | Stop: 2023-04-14 | NDC 72888001405

## 2023-04-14 MED ADMIN — TIZANIDINE 4 MG PO TAB [14793]: 2 mg | ORAL | @ 10:00:00 | NDC 00904641861

## 2023-04-14 MED ADMIN — POTASSIUM CHLORIDE 20 MEQ PO TBTQ [35943]: 60 meq | ORAL | @ 22:00:00 | NDC 00832532510

## 2023-04-14 MED ADMIN — BUMETANIDE 0.25 MG/ML IJ SOLN [9308]: 4 mg | INTRAVENOUS | @ 15:00:00 | NDC 72205010101

## 2023-04-14 MED ADMIN — TIZANIDINE 4 MG PO TAB [14793]: 2 mg | ORAL | @ 01:00:00 | NDC 00904641861

## 2023-04-14 MED ADMIN — POTASSIUM CHLORIDE 20 MEQ PO TBTQ [35943]: 60 meq | ORAL | @ 14:00:00 | NDC 00832532510

## 2023-04-14 MED ADMIN — INSULIN NPH AND REGULAR HUMAN 100 UNIT/ML (70-30) SC INPN [94411]: 70 [IU] | SUBCUTANEOUS | @ 22:00:00 | NDC 00169300701

## 2023-04-15 ENCOUNTER — Encounter: Admit: 2023-04-15 | Discharge: 2023-04-15 | Payer: MEDICARE

## 2023-04-15 MED ADMIN — METOPROLOL SUCCINATE 50 MG PO TB24 [77931]: 50 mg | ORAL | @ 14:00:00 | Stop: 2023-04-15 | NDC 00904632361

## 2023-04-15 MED ADMIN — POTASSIUM CHLORIDE 20 MEQ PO TBTQ [35943]: 60 meq | ORAL | @ 14:00:00 | Stop: 2023-04-15 | NDC 00832532510

## 2023-04-15 MED ADMIN — TIZANIDINE 4 MG PO TAB [14793]: 2 mg | ORAL | @ 04:00:00 | NDC 00904641861

## 2023-04-15 MED ADMIN — TIZANIDINE 4 MG PO TAB [14793]: 2 mg | ORAL | @ 15:00:00 | Stop: 2023-04-15 | NDC 00904641861

## 2023-04-15 MED ADMIN — BUMETANIDE 0.25 MG/ML IJ SOLN [9308]: 4 mg | INTRAVENOUS | @ 14:00:00 | Stop: 2023-04-15 | NDC 72205010101

## 2023-04-16 ENCOUNTER — Encounter: Admit: 2023-04-16 | Discharge: 2023-04-16 | Payer: MEDICARE

## 2023-04-16 NOTE — Telephone Encounter
Patient Discharge Date from hospital: 04/15/23  Date Call Attempted: 04/16/23  Number of Attempts: 1  Date Call Completed:      Call placed to pt for 72 hour post hospital follow up call. LM on authorized VM requesting cb. MyChart message sent.     Two Patient Identifier complete: Yes []     Next Appointment    Next follow-up appointment on  04/20/2023 at 1130 with Gates Rigg, APRN at our [facility] clinic.    Transportation    Does pt have transportation?  Yes []     No []    NA []      Home Health        Medications    Does pt have all medications? Yes  []     No []       START taking:  tiZANidine (ZANAFLEX)  CHANGE how you take:  metoprolol succinate XL (TOPROL XL)  potassium chloride SR (K-DUR)  torsemide (DEMADEX)    Diet    - 200 mg cholesterol, 2 G Na, 2 L fluid restriction   - 1600 and 2000 calories per day. This is equal to 60g (grams) of carbohydrates per meal, and 30g of carbohydrates for a bedtime snack     Is patient following prescribed diet and restrictions?  Yes []    No []      Scale/Weight    Does pt have a scale at home?  Yes []    No []     Did pt weight first thing this morning?  Yes []    No []      If yes, what was pt's first morning weight today?      Signs and Symptoms    Pt reports the following symptoms:           Pt verbalized understanding of signs and symptoms of HF and when to contact a provider or seek immediate assistance at the ER.    Was pt given zone sheet? Yes [x]   No []     Intervention(s)          Plan of Care    Continued education needed for heart failure symptom management and when to contact our office.

## 2023-04-17 ENCOUNTER — Encounter: Admit: 2023-04-17 | Discharge: 2023-04-17 | Payer: MEDICARE

## 2023-04-17 NOTE — Telephone Encounter
Altamease Oiler, MD  Rogelia Boga, RN  Caller: Unspecified (Today,  8:28 AM)  He can proceed is moderate risk from a cardiovascular standpoint.  No further testing indicated.  Thank you!    Faxed clearance to [facility] urology.

## 2023-04-17 NOTE — Telephone Encounter
Will route to Dr. Sandria Manly for review and recommendations.

## 2023-04-18 ENCOUNTER — Encounter: Admit: 2023-04-18 | Discharge: 2023-04-18 | Payer: MEDICARE

## 2023-04-25 ENCOUNTER — Encounter: Admit: 2023-04-25 | Discharge: 2023-04-25 | Payer: MEDICARE

## 2023-04-30 ENCOUNTER — Encounter: Admit: 2023-04-30 | Discharge: 2023-04-30 | Payer: MEDICARE

## 2023-05-02 ENCOUNTER — Encounter: Admit: 2023-05-02 | Discharge: 2023-05-02 | Payer: MEDICARE

## 2023-05-04 ENCOUNTER — Encounter: Admit: 2023-05-04 | Discharge: 2023-05-04 | Payer: MEDICARE

## 2023-05-07 ENCOUNTER — Encounter: Admit: 2023-05-07 | Discharge: 2023-05-07 | Payer: MEDICARE

## 2023-05-08 ENCOUNTER — Encounter: Admit: 2023-05-08 | Discharge: 2023-05-08 | Payer: MEDICARE

## 2023-05-10 ENCOUNTER — Encounter: Admit: 2023-05-10 | Discharge: 2023-05-10 | Payer: MEDICARE

## 2023-05-10 NOTE — Progress Notes
H&E Slides of ACC # [id number], Path Date 04/25/2023, requested on 05/10/2023 from [facility].    Contact for outside path lab is [phone].      FED EX shipping label faxed (fax # [phone]) with shipment tracking # [id number].

## 2023-05-11 ENCOUNTER — Encounter: Admit: 2023-05-11 | Discharge: 2023-05-11 | Payer: MEDICARE

## 2023-05-14 ENCOUNTER — Encounter: Admit: 2023-05-14 | Discharge: 2023-05-14 | Payer: MEDICARE

## 2023-05-14 ENCOUNTER — Ambulatory Visit: Admit: 2023-05-14 | Discharge: 2023-05-15 | Payer: MEDICARE

## 2023-05-14 DIAGNOSIS — I5032 Chronic diastolic (congestive) heart failure: Secondary | ICD-10-CM

## 2023-05-14 DIAGNOSIS — I5033 Acute on chronic diastolic (congestive) heart failure: Secondary | ICD-10-CM

## 2023-05-14 DIAGNOSIS — I10 Essential (primary) hypertension: Secondary | ICD-10-CM

## 2023-05-14 DIAGNOSIS — I482 Chronic atrial fibrillation, unspecified: Secondary | ICD-10-CM

## 2023-05-14 NOTE — Progress Notes
Date of Service: 05/14/2023    Ian Velez is a 82 y.o. male.   Patient was identified using dual identification of name and date of birth. He was a new patient for me today.     HPI         Hospital discharge follow-up  Ian Velez  is a 82 y.o.  male  followed by Dr. Sandria Manly  He presents for a post-hospitalization follow up today.    His discharge summary was reviewed, and a thorough medication reconciliation was undertaken.    Reviewed hospital course with patient. Reviewed follow-up plan with patient. Reviewed current symptoms with patient.     Date of admission: 04/10/23   Date of discharge: 04/15/23   Date of Transition of Care Phone Call: NA     A/P as below:     Ian Velez is a 82 y.o. 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  He has had a previous trauma injury to left lower extremity below the knee.  He follows with Dr. Lissa Morales in the cardiology clinic. He presented as a direct admission from cardiology clinic for RHC and cardiac optimization prior to upcoming urologic procedure. RHC done 10/9, notable for elevated R and L heart filling pressures, significantly elevated PA pressures, low CI by thermodilution and fick.     He was IV diuresed with bumex. He received IV iron. To be considered for PA pressure sensor monitoring as outpatient, pending f/u with Dr Sandria Manly. Consider GLP-1 agonist outpatient as well.       Today's visit:     He presented today reporting that he underwent his bladder surgery and was not admitted after surgery (got through anesthesia without difficulty) but was readmitted with hematuria/clots in his urinary tract after and had to stay for two nights. He had to have something cauterized to stop the bleeding and has been fine since that time. He no longer has a catheter and has no further bleeding. He has been referred to a [place] urologist for evaluation before going through additional procedures as they think there may be some cancer left in his bladder post procedure.     He is SOB but it is much better than it was prior to his admission for IV diuresis. He is able to walk 50-75 feet before feeling winded. His weight at home upon discharge was 253 pounds and he has been up to 262 pounds (post bladder surgery) but is back to 252 pounds today at home. He is urinating well. No dizziness. No worsening orthopnea (sleeps with HOB elevated chronically) and thinks he is probably drinking too much fluid. BP at home 100s/70s and HR 90s-100s in permanent afib. No LE swelling or abdominal bloating. No dizziness or chest pain.     He was seen today with his wife Ian Velez and daughter Ian Velez.                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     11/15/2021 MRI Left foot (Ian Velez): Ulcer along the medial most aspect of the great toe distally is noted. There is extensive soft tissue swelling of the great toe extending towards the midfoot and hindfoot with post gadolinium enhancement consistent with cellulitis.    11/14/2021 US Duplex left lower extremity veins (Ian [facility]elez): No evidence of dep venous thrombosis in the lower extremity veins as visualized.  10/31/2021 Bilateral lower ext arterial doppler US (Ian Velez): 75% or greater stenosis involving the distal right SFA and/or proximal popliteal artery. Proximal trifurcation vessels on the right are not evaluated.       Diastolic dysfunction with chronic heart failure (HCC) 01/11/2018    Obesity, Class III, BMI 40-49.9 (morbid obesity) (HCC) 01/11/2018    Pulmonary hypertension (HCC)     Obstructive sleep apnea     OA (osteoarthritis)     Lumbosacral spondylolysis     Hypoxemia     Essential hypertension      Echo - 01/30/18 at [facility] - 1. Qualitatively normal LV size and function, EF ~ 55%; 2. Unable to accurately assess diastolic function due to underlying atrial fibrillation; 3. Qualitatively the RV appears mildly dilated with mildly reduced systolic function 4. Qualitatively the RA appears mildly dilated; 5. Valvular structures were not well visualized.  No significant Doppler abnormalities; 6. Estimated peak systolic PA pressure =  45 mmHg; 7. No pericardial effusion      Mixed hyperlipidemia     History of bladder carcinoma     DM (diabetes mellitus), type 2 (HCC)     Stage 3 severe COPD by GOLD classification (HCC)     Chronic atrial fibrillation (HCC)     Coronary artery disease of native artery of native heart with stable angina pectoris (HCC)      11/07/2017 coronary angiogram  - Highly complex severe disease of the right coronary and circumflex with a complete occlusion of both the circumflex and right coronary artery with TIMI 1 flow distal in both arteries.Mild disease of the LAD and left main.Successful intervention of the right coronary, re-establishing normal flow.  There continues to be serial 90% stenoses in the distal right coronary artery, that are also heavily calcified.      Chronic GERD     On home oxygen therapy      3L      Chronic stable angina (HCC)     Chronic anticoagulation          ROS   see HPI      Physical Exam  Alert and oriented with appropriate communication skills  JVD 6 cm at 90 degrees with (-) HJR  + abdominal distention (tight round obese abdomen)   Trace to 1+ bil  LE edema  Breathing comfortably without wheezing, rales  or rhonchi  Auscultated heart with normal S1S2, regular rhythm, no obvious murmur   Riding in a wheelchair      Cardiovascular Studies  Echocardiogram 04/11/23  Low normal left ventricular systolic function with an EF of 50%.  The LV cavity is normal in size.  There are no focal areas of hypokinesis.    Unable to assess diastolic function on the current study.  The RV appears mildly dilated mildly impaired function.  Mitral annular calcification with nonspecific mitral valve thickening.  There is no stenosis.  There is mild to moderate MR.  Moderate TR.  The aortic valve is sclerotic and calcified.  There is no significant stenosis.  Trace AI.  Elevated peak PAP of 55 mmHg.  The right atrial pressure is normal at 3 mmHg.  Normal aortic root size.  No pericardial effusion.       Prior study was obtained on 02/16/2023, showed an EF of 60%, the RV was dilated with impaired function, there was mitral annular calcification with mild MR, mild TR, the aortic valve was calcified with mild AI, the aorta was normal in size, no pericardial effusion, the range pressure was elevated at 15 mmHg, the peak pulmonary pressure was elevated at 82 mmHg.     The LV systolic function appears slightly less dynamic on the current study.  The peak pulmonary pressure was calculated to be less elevated on the current study, the right atrial pressure is normal on the current study.     Regadenoson MPI stress test 03/26/2023  Left Ventricular Ejection Fraction (post stress, in the resting state) =  48 %.      Left Ventricular End Diastolic Volume: 96.04 mL SUMMARY/OPINION:    This study is low risk for significant inducible myocardial ischemia, there is mostly fixed inferior defect inferior defect with mild hypokinesis suggestive of prior myocardial injury.   Left ventricular systolic function is mildly reduced.   There are no high risk prognostic indicators present.    The pharmacologic ECG portion of the study is negative for ischemia.    04/11/23 RHC:  B/P 142/76 (104), HR 77 bpm, RA sat 66%, PA sat 65%, aortic sat 98%, RA 15, RV 80/15, PCWP 18, PA 82/23 (43), TPG 25, DPG 5, PVR 6.1, SVR 1736, CO/CI (TDL) 4.2/1.8, CO/CI (Fick) 4.1/1.8      Problems Addressed Today  Encounter Diagnoses   Name Primary?    Acute on chronic diastolic heart failure with preserved ejection fraction (HCC) Yes    Diastolic dysfunction with chronic heart failure (HCC)     Chronic atrial fibrillation (HCC)     Essential hypertension        Assessment and Plan        HFpEF likely related to underlying HTN, OSA, CKD, afib  NYHA class II-III  Dry weight: 250 pounds (?) home weight 252 pounds today  - NTproBNP pending today  - discussed CardioMems although not appropriate at this time given his ongoing bladder treatment needs. Would consider at a later date. Sent home with information today  - call if home weight > 255 pounds other wise f/u with Dr Sandria Manly in [place] in March as scheduled  Lab Results   Component Value Date    NTPROBNP 194.0 04/10/2023    NTPROBNP 544 (H) 03/26/2023       GDMT Current Dose Changes made at visit   Aldosterone antagonist Spironolactone 25 mg daily      SGLT2i Farxiga 10 mg daily  Diuretics Torsemide 60 mg BID          Atrial Fibrillation, Permanent:   A/c with rivaroxaban 20 mg daily  Treated with Toprol XL  Followed by Dr. Sandria Manly    Pulmonary Hypertension with RV pressure and volume overload  COPD  - hx RV dilation with reduced RV function on TTE  - s/p RHC 10/9 with elevated R and L heart filling pressures, significantly elevated PA pressures (PAP 82/23, mPAP ), low CI by thermodilution and fick  - Risk factors for pulm HTN include COPD, OSA, CHF    Renal Surveillance:  Lab Results   Component Value Date    CR 0.9 05/14/2023    CR 1.05 04/15/2023    CR 0.99 04/14/2023    CR 1.05 04/13/2023      POC creat today 0.9    CAD  Chronic stable angina  S/p complex PCI to RCA  Last LHC-11/07/2017  - Highly complex severe disease of the right coronary and circumflex with a complete occlusion of both the circumflex and right coronary artery with TIMI 1 flow distal in both arteries.Mild disease of the LAD and left main.Successful intervention of the right coronary, re-establishing normal flow.  There continues to be serial 90% stenoses in the distal right coronary artery, that are also heavily calcified.  -MPI stress test 03/26/23:  no evidence of myocardial ischemia, only injury pattern in inferior wall consistent with known disease in right coronary artery and left circumflex artery.   > Cont with asa and atorvastatin, imdur 30 mg daily as antianginal agent    IDA  S/p IV iron 04/11/23  - repeat iron panel in January    History of recurrent lower extremity cellulitis  > Continue PTA suppressive Doxycycline BID    OSA  > Bipap nightly via nose mask    DM Type II   > on Farxiga, insulin, metformin  Lab Results   Component Value Date/Time    HGBA1C 7.6 (H) 04/11/2023 03:44 AM    HGBA1C 8.4 (H) 06/22/2020 12:00 AM    HGBA1C 7.5 (H) 12/22/2019 12:00 AM        Dyslipidemia  > cont on atorvastatin 80 mg QHS  Lab Results   Component Value Date/Time    CHOL 97 04/11/2023 03:44 AM    TRIG 110 04/11/2023 03:44 AM    HDL 37 (L) 04/11/2023 03:44 AM    LDL 50 04/11/2023 03:44 AM    VLDL 22 04/11/2023 03:44 AM    NONHDLCHOL 60 04/11/2023 03:44 AM    CHOLHDLC 2.1 01/11/2023 12:00 AM      Morbid obesity  Body mass index is 41.05 kg/m?.  > Need to consider candidacy for GLP-1 agonist as an outpatient         Current Medications (including today's revisions)   acetaminophen (TYLENOL) 325 mg tablet Take two tablets by mouth every 6 hours as needed for Pain.    albuterol 0.5% (PROVENTIL; VENTOLIN) 2.5 mg/0.5 mL nebulizer solution Inhale one each solution by nebulizer as directed four times daily as needed for Shortness of Breath or Wheezing.    albuterol sulfate (PROAIR HFA) 90 mcg/actuation HFA aerosol inhaler Inhale two puffs by mouth into the lungs as Needed for Wheezing or Shortness of Breath.    aspirin EC (ASPIR-LOW) 81 mg tablet Take one tablet by mouth daily.    atorvastatin (LIPITOR) 80 mg tablet TAKE ONE TABLET BY MOUTH DAILY    cyanocobalamin (vitamin B-12) 500 mcg tablet Take two tablets  by mouth daily.    dapagliflozin propanediol (FARXIGA) 10 mg tablet Take one tablet by mouth daily.    doxycycline hyclate (VIBRACIN) 100 mg tablet Take one tablet by mouth twice daily.    ELDERBERRY FRUIT PO Take 2 tablets by mouth daily.    enoxaparin (LOVENOX) 120 mg/ 0.8 mL syringe Inject 0.8 mL under the skin every 12 hours. Use for 2 days while holding Xarelto for procedure.    HYDROcodone/acetaminophen (NORCO) 5/325 mg tablet Take one tablet by mouth as Needed for Pain.    hydrocortisone (HYTONE) 2.5 % topical cream APPLY TOPICALLY TO THE FACE TWICE DAILY FOR 1 TO 2 WEEKS AS NEEDED FLARES MIX IN THE HAND WITHH KETOCONAZOLE CREAM    insulin aspart 70/30 (+) (NOVOLOG MIX 70/30) 100 unit/mL (70-30) Inject eighty Units under the skin twice daily.    isosorbide mononitrate ER (IMDUR) 30 mg tablet,extended release 24 hr Take one tablet by mouth every morning.    magnesium oxide (PHILLIPS) 500 mg magnesium tablet Take one tablet by mouth at bedtime daily.    metFORMIN (GLUCOPHAGE) 500 mg tablet Take one tablet by mouth twice daily with meals. Hold 48 hours after cath procedure.  Resume with evening dose on 11/09/17    metoprolol succinate XL (TOPROL XL) 50 mg extended release tablet Take one tablet by mouth daily. Indications: .    montelukast (SINGULAIR) 10 mg tablet Take one tablet by mouth at bedtime daily. MULTIVITAMIN PO Take 1 tablet by mouth daily.    nitroglycerin (NITROSTAT) 0.4 mg tablet Place one tablet under tongue every 5 minutes as needed for Chest Pain. Max of 3 tablets, call 911.    potassium chloride SR (K-DUR) 20 mEq tablet Take three tablets by mouth twice daily. Take with a meal and a full glass of water.  Indications: prevention of low potassium in the blood    rivaroxaban (XARELTO) 20 mg tablet Take one tablet by mouth daily. Take with food.    spironolactone (ALDACTONE) 25 mg tablet TAKE 1 TABLET BY MOUTH EVERY DAY, TAKE WITH FOOD    SYMBICORT 80-4.5 mcg/actuation aerosol inhaler Inhale 2 PUFFS IN THE morning AND AT bedtime. RINSE MOUTH AND THROAT AFTER USE TO REDUCE AFTERTASTE AND INCIDENCE OF CANDIDIASIS. DO NOT SWALLOW MOUTH WITH WATER AFTER USE.    tiotropium bromide (SPIRIVA) 18 mcg capsule for inhaler Place one capsule into inhaler and inhale into lungs as directed daily.    tiZANidine (ZANAFLEX) 2 mg tablet Take one tablet by mouth every 8 hours as needed. Indications: muscle spasm    torsemide (DEMADEX) 20 mg tablet Take 60mg  twice daily. Take by mouth.  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            Total Time Today was 45 minutes in the following activities: Preparing to see the patient, Obtaining and/or reviewing separately obtained history, Performing a medically appropriate examination and/or evaluation, Counseling and educating the patient/family/caregiver, Ordering medications, tests, or procedures, Referring and communication with other [facility] care professionals (when not separately reported), and Documenting clinical information in the electronic or other [facility] record     Sue Lush,  CHFN, APRN, NP-C  Advanced Heart Failure APP  [facility]      Collaborating physician Bing Matter, MD

## 2023-05-15 ENCOUNTER — Encounter: Admit: 2023-05-15 | Discharge: 2023-05-15 | Payer: MEDICARE

## 2023-05-15 ENCOUNTER — Ambulatory Visit: Admit: 2023-05-15 | Discharge: 2023-05-16 | Payer: MEDICARE

## 2023-05-16 ENCOUNTER — Encounter: Admit: 2023-05-16 | Discharge: 2023-05-16 | Payer: MEDICARE

## 2023-05-16 NOTE — Progress Notes
Disc of images received from [facility]. Ian Velez.    Images/Study and date performed on disc include:    CT Urogram dated 03/12/2022       Disc of images delivered to [address] Basket to be loaded into PACS.   Requested images be uploaded by 05/18/2023

## 2023-05-18 ENCOUNTER — Encounter: Admit: 2023-05-18 | Discharge: 2023-05-18 | Payer: MEDICARE

## 2023-05-22 ENCOUNTER — Encounter: Admit: 2023-05-22 | Discharge: 2023-05-22 | Payer: MEDICARE

## 2023-05-22 DIAGNOSIS — Z8551 Personal history of malignant neoplasm of bladder: Secondary | ICD-10-CM

## 2023-05-22 DIAGNOSIS — R0902 Hypoxemia: Secondary | ICD-10-CM

## 2023-05-22 DIAGNOSIS — D494 Neoplasm of unspecified behavior of bladder: Secondary | ICD-10-CM

## 2023-05-22 DIAGNOSIS — J449 Chronic obstructive pulmonary disease, unspecified: Secondary | ICD-10-CM

## 2023-05-22 DIAGNOSIS — E66813 Class 3 severe obesity due to excess calories with serious comorbidity and body mass index (BMI) of 45.0 to 49.9 in adult (HCC): Secondary | ICD-10-CM

## 2023-05-22 DIAGNOSIS — I5033 Acute on chronic diastolic (congestive) heart failure: Secondary | ICD-10-CM

## 2023-05-22 DIAGNOSIS — I482 Chronic atrial fibrillation, unspecified: Secondary | ICD-10-CM

## 2023-05-22 DIAGNOSIS — E1159 Type 2 diabetes mellitus with other circulatory complications: Secondary | ICD-10-CM

## 2023-05-22 DIAGNOSIS — I10 Essential (primary) hypertension: Secondary | ICD-10-CM

## 2023-05-22 DIAGNOSIS — I25118 Atherosclerotic heart disease of native coronary artery with other forms of angina pectoris: Secondary | ICD-10-CM

## 2023-05-22 DIAGNOSIS — E782 Mixed hyperlipidemia: Secondary | ICD-10-CM

## 2023-06-18 ENCOUNTER — Encounter: Admit: 2023-06-18 | Discharge: 2023-06-18 | Payer: MEDICARE

## 2023-06-18 NOTE — Telephone Encounter
Received a call from [place], with the Pre-op clinic [phone]). Pt is scheduled for a bladder tumor resection and urethrotomy on 1/13.  They are requesting approval to hold Xarelto prior to procedure and how may days Dr. Sandria Manly would be okay with holding medication.      Ian Velez is on Xarelto for atrial fibrillation.  His CHADSVASC score is 6.      Will route to Dr. Sandria Manly for review and recommendations.

## 2023-06-20 ENCOUNTER — Encounter: Admit: 2023-06-20 | Discharge: 2023-06-20 | Payer: MEDICARE

## 2023-06-22 ENCOUNTER — Encounter: Admit: 2023-06-22 | Discharge: 2023-06-22 | Payer: MEDICARE

## 2023-06-22 ENCOUNTER — Ambulatory Visit: Admit: 2023-06-22 | Discharge: 2023-06-23 | Payer: MEDICARE

## 2023-07-06 ENCOUNTER — Encounter: Admit: 2023-07-06 | Discharge: 2023-07-06 | Payer: MEDICARE

## 2023-07-06 DIAGNOSIS — Z8551 Personal history of malignant neoplasm of bladder: Secondary | ICD-10-CM

## 2023-07-10 ENCOUNTER — Encounter: Admit: 2023-07-10 | Discharge: 2023-07-10 | Payer: MEDICARE

## 2023-07-16 ENCOUNTER — Encounter: Admit: 2023-07-16 | Discharge: 2023-07-16 | Payer: MEDICARE

## 2023-07-16 ENCOUNTER — Encounter: Admit: 2023-07-16 | Discharge: 2023-07-17 | Payer: MEDICARE

## 2023-07-16 ENCOUNTER — Inpatient Hospital Stay: Admit: 2023-07-16 | Discharge: 2023-07-20 | Disposition: A | Discharge status: Home Health | Payer: MEDICARE

## 2023-07-16 DIAGNOSIS — C678 Malignant neoplasm of overlapping sites of bladder: Secondary | ICD-10-CM

## 2023-07-16 LAB — CONFIRMATION BLOOD TYPE

## 2023-07-16 LAB — POC GLUCOSE
~~LOC~~ BKR POC GLUCOSE: 127 mg/dL — ABNORMAL HIGH (ref 70–100)
~~LOC~~ BKR POC GLUCOSE: 147 mg/dL — ABNORMAL HIGH (ref 70–100)
~~LOC~~ BKR POC GLUCOSE: 334 mg/dL — ABNORMAL HIGH (ref 70–100)

## 2023-07-16 LAB — TYPE & CROSSMATCH
~~LOC~~ BKR ANTIBODY SCREEN: NEGATIVE
~~LOC~~ BKR UNITS ORDERED: 0

## 2023-07-16 MED ORDER — SPIRONOLACTONE 50 MG PO TAB
25 mg | Freq: Every day | ORAL | 0 refills | Status: DC
Start: 2023-07-16 — End: 2023-07-20
  Administered 2023-07-17 – 2023-07-20 (×4): 25 mg via ORAL

## 2023-07-16 MED ORDER — SUGAMMADEX 100 MG/ML IV SOLN
INTRAVENOUS | 0 refills | Status: DC
Start: 2023-07-16 — End: 2023-07-16

## 2023-07-16 MED ORDER — SODIUM CHLORIDE 0.9% IRRIGATION BAG
3000 mL | 0 refills | Status: DC
Start: 2023-07-16 — End: 2023-07-20
  Administered 2023-07-16 – 2023-07-18 (×28): 3000 mL

## 2023-07-16 MED ORDER — HYDROMORPHONE (PF) 2 MG/ML IJ SYRG
.5-1 mg | INTRAVENOUS | 0 refills | Status: DC | PRN
Start: 2023-07-16 — End: 2023-07-16

## 2023-07-16 MED ORDER — FENTANYL CITRATE (PF) 50 MCG/ML IJ SOLN
12.5 ug | INTRAVENOUS | 0 refills | Status: DC | PRN
Start: 2023-07-16 — End: 2023-07-16

## 2023-07-16 MED ORDER — BUDESONIDE-FORMOTEROL 80-4.5 MCG/ACTUATION IN HFAA
2 | Freq: Two times a day (BID) | RESPIRATORY_TRACT | 0 refills | Status: DC
Start: 2023-07-16 — End: 2023-07-20
  Administered 2023-07-17 – 2023-07-18 (×3): 2 via RESPIRATORY_TRACT

## 2023-07-16 MED ORDER — CALCIUM CARBONATE-VITAMIN D3 500 MG-5 MCG (200 UNIT) PO TAB
1 | Freq: Every evening | ORAL | 0 refills | Status: DC
Start: 2023-07-16 — End: 2023-07-20
  Administered 2023-07-17 – 2023-07-20 (×4): 1 via ORAL

## 2023-07-16 MED ORDER — ROCURONIUM 10 MG/ML IV SOLN
INTRAVENOUS | 0 refills | Status: DC
Start: 2023-07-16 — End: 2023-07-16

## 2023-07-16 MED ORDER — ISOSORBIDE MONONITRATE 30 MG PO TB24
30 mg | Freq: Every morning | ORAL | 0 refills | Status: DC
Start: 2023-07-16 — End: 2023-07-20
  Administered 2023-07-17 – 2023-07-20 (×4): 30 mg via ORAL

## 2023-07-16 MED ORDER — DAPAGLIFLOZIN PROPANEDIOL 10 MG PO TAB
10 mg | Freq: Every day | ORAL | 0 refills | Status: DC
Start: 2023-07-16 — End: 2023-07-20
  Administered 2023-07-17 – 2023-07-20 (×4): 10 mg via ORAL

## 2023-07-16 MED ORDER — MULTIVITAMIN PO TAB
1 | Freq: Every day | ORAL | 0 refills | Status: DC
Start: 2023-07-16 — End: 2023-07-20
  Administered 2023-07-17 – 2023-07-20 (×4): 1 via ORAL

## 2023-07-16 MED ORDER — DOXYCYCLINE HYCLATE 100 MG PO TAB
100 mg | Freq: Two times a day (BID) | ORAL | 0 refills | Status: DC
Start: 2023-07-16 — End: 2023-07-20
  Administered 2023-07-17 – 2023-07-20 (×8): 100 mg via ORAL

## 2023-07-16 MED ORDER — HALOPERIDOL LACTATE 5 MG/ML IJ SOLN
1 mg | Freq: Once | INTRAVENOUS | 0 refills | Status: CP | PRN
Start: 2023-07-16 — End: ?
  Administered 2023-07-16: 18:00:00 1 mg via INTRAVENOUS

## 2023-07-16 MED ORDER — ASPIRIN 81 MG PO TBEC
81 mg | Freq: Every day | ORAL | 0 refills | Status: DC
Start: 2023-07-16 — End: 2023-07-20
  Administered 2023-07-20: 14:00:00 81 mg via ORAL

## 2023-07-16 MED ORDER — ACETAMINOPHEN 325 MG PO TAB
650 mg | Freq: Once | ORAL | 0 refills | Status: DC | PRN
Start: 2023-07-16 — End: 2023-07-16

## 2023-07-16 MED ORDER — PHENYLEPHRINE HCL IN 0.9% NACL 1 MG/10 ML (100 MCG/ML) IV SYRG
INTRAVENOUS | 0 refills | Status: DC
Start: 2023-07-16 — End: 2023-07-16

## 2023-07-16 MED ORDER — DEXTROSE 50 % IN WATER (D50W) IV SYRG
12.5-25 g | INTRAVENOUS | 0 refills | Status: DC | PRN
Start: 2023-07-16 — End: 2023-07-20

## 2023-07-16 MED ORDER — ENOXAPARIN 40 MG/0.4 ML SC SYRG
40 mg | Freq: Every day | SUBCUTANEOUS | 0 refills | Status: DC
Start: 2023-07-16 — End: 2023-07-16

## 2023-07-16 MED ORDER — ARTIFICIAL TEARS (PF) SINGLE DOSE DROPS GROUP
OPHTHALMIC | 0 refills | Status: DC
Start: 2023-07-16 — End: 2023-07-16

## 2023-07-16 MED ORDER — INSULIN ASPART 100 UNIT/ML SC FLEXPEN
0-6 [IU] | Freq: Before meals | SUBCUTANEOUS | 0 refills | Status: DC
Start: 2023-07-16 — End: 2023-07-17
  Administered 2023-07-17: 4 [IU] via SUBCUTANEOUS

## 2023-07-16 MED ORDER — HEXAMINOLEVULINATE 100 MG IS SOLR
100 mg | Freq: Once | INTRAVESICAL | 0 refills | Status: CP
Start: 2023-07-16 — End: ?
  Administered 2023-07-16: 14:00:00 100 mg via INTRAVESICAL

## 2023-07-16 MED ORDER — POLYETHYLENE GLYCOL 3350 17 GRAM PO PWPK
1 | Freq: Two times a day (BID) | ORAL | 0 refills | Status: DC
Start: 2023-07-16 — End: 2023-07-20
  Administered 2023-07-17 – 2023-07-19 (×3): 17 g via ORAL

## 2023-07-16 MED ORDER — PROPOFOL INJ 10 MG/ML IV VIAL
INTRAVENOUS | 0 refills | Status: DC
Start: 2023-07-16 — End: 2023-07-16

## 2023-07-16 MED ORDER — METOPROLOL SUCCINATE 50 MG PO TB24
50 mg | Freq: Every day | ORAL | 0 refills | Status: DC
Start: 2023-07-16 — End: 2023-07-20
  Administered 2023-07-17 – 2023-07-20 (×4): 50 mg via ORAL

## 2023-07-16 MED ORDER — SUCCINYLCHOLINE CHLORIDE 20 MG/ML IJ SOLN
INTRAVENOUS | 0 refills | Status: DC
Start: 2023-07-16 — End: 2023-07-16

## 2023-07-16 MED ORDER — LACTATED RINGERS IV SOLP
INTRAVENOUS | 0 refills | Status: DC
Start: 2023-07-16 — End: 2023-07-16

## 2023-07-16 MED ORDER — DEXAMETHASONE SODIUM PHOSPHATE 4 MG/ML IJ SOLN
INTRAVENOUS | 0 refills | Status: DC
Start: 2023-07-16 — End: 2023-07-16

## 2023-07-16 MED ORDER — MONTELUKAST 10 MG PO TAB
10 mg | Freq: Every evening | ORAL | 0 refills | Status: DC
Start: 2023-07-16 — End: 2023-07-20
  Administered 2023-07-17 – 2023-07-20 (×4): 10 mg via ORAL

## 2023-07-16 MED ORDER — TIOTROPIUM BROMIDE 2.5 MCG/ACTUATION IN MIST
2 | Freq: Every day | RESPIRATORY_TRACT | 0 refills | Status: DC
Start: 2023-07-16 — End: 2023-07-20
  Administered 2023-07-17: 15:00:00 2 via RESPIRATORY_TRACT

## 2023-07-16 MED ORDER — OXYBUTYNIN CHLORIDE 5 MG PO TAB
5 mg | Freq: Three times a day (TID) | ORAL | 0 refills | Status: DC | PRN
Start: 2023-07-16 — End: 2023-07-17
  Administered 2023-07-16 – 2023-07-17 (×4): 5 mg via ORAL

## 2023-07-16 MED ORDER — LIDOCAINE (PF) 200 MG/10 ML (2 %) IJ SYRG
INTRAVENOUS | 0 refills | Status: DC
Start: 2023-07-16 — End: 2023-07-16

## 2023-07-16 MED ORDER — OXYBUTYNIN CHLORIDE 5 MG PO TR24
5 mg | Freq: Three times a day (TID) | ORAL | 0 refills | Status: DC | PRN
Start: 2023-07-16 — End: 2023-07-16

## 2023-07-16 MED ORDER — OXYCODONE 5 MG PO TAB
5 mg | Freq: Once | ORAL | 0 refills | Status: CP | PRN
Start: 2023-07-16 — End: ?
  Administered 2023-07-16: 19:00:00 5 mg via ORAL

## 2023-07-16 MED ORDER — ONDANSETRON HCL (PF) 4 MG/2 ML IJ SOLN
INTRAVENOUS | 0 refills | Status: DC
Start: 2023-07-16 — End: 2023-07-16

## 2023-07-16 MED ORDER — ENOXAPARIN 40 MG/0.4 ML SC SYRG
40 mg | Freq: Two times a day (BID) | SUBCUTANEOUS | 0 refills | Status: DC
Start: 2023-07-16 — End: 2023-07-19
  Administered 2023-07-19: 03:00:00 40 mg via SUBCUTANEOUS

## 2023-07-16 MED ORDER — CEFAZOLIN 2 GRAM IV SOLR
2 g | Freq: Once | INTRAVENOUS | 0 refills | Status: CP
Start: 2023-07-16 — End: ?
  Administered 2023-07-16: 15:00:00 2 g via INTRAVENOUS

## 2023-07-16 MED ORDER — METFORMIN 500 MG PO TAB
500 mg | Freq: Two times a day (BID) | ORAL | 0 refills | Status: DC
Start: 2023-07-16 — End: 2023-07-20
  Administered 2023-07-17 – 2023-07-20 (×7): 500 mg via ORAL

## 2023-07-16 MED ORDER — MAGNESIUM OXIDE 400 MG (241.3 MG MAGNESIUM) PO TAB
600 mg | Freq: Every evening | ORAL | 0 refills | Status: DC
Start: 2023-07-16 — End: 2023-07-20
  Administered 2023-07-17 – 2023-07-20 (×4): 600 mg via ORAL

## 2023-07-16 MED ORDER — SENNOSIDES 8.6 MG PO TAB
1 | Freq: Two times a day (BID) | ORAL | 0 refills | Status: DC
Start: 2023-07-16 — End: 2023-07-20
  Administered 2023-07-17 – 2023-07-20 (×5): 1 via ORAL

## 2023-07-16 MED ORDER — ACETAMINOPHEN 1,000 MG/100 ML (10 MG/ML) IV SOLN
INTRAVENOUS | 0 refills | Status: DC
Start: 2023-07-16 — End: 2023-07-16

## 2023-07-16 MED ORDER — CYANOCOBALAMIN (VITAMIN B-12) 500 MCG PO TAB
1000 ug | Freq: Every day | ORAL | 0 refills | Status: DC
Start: 2023-07-16 — End: 2023-07-20
  Administered 2023-07-17 – 2023-07-20 (×4): 1000 ug via ORAL

## 2023-07-16 MED ORDER — TORSEMIDE 20 MG PO TAB
60 mg | Freq: Two times a day (BID) | ORAL | 0 refills | Status: DC
Start: 2023-07-16 — End: 2023-07-20
  Administered 2023-07-17 – 2023-07-20 (×7): 60 mg via ORAL

## 2023-07-16 MED ORDER — FENTANYL CITRATE (PF) 50 MCG/ML IJ SOLN
INTRAVENOUS | 0 refills | Status: DC
Start: 2023-07-16 — End: 2023-07-16

## 2023-07-16 MED ORDER — TIZANIDINE 4 MG PO TAB
2 mg | ORAL | 0 refills | Status: DC | PRN
Start: 2023-07-16 — End: 2023-07-17
  Administered 2023-07-16 – 2023-07-17 (×2): 2 mg via ORAL

## 2023-07-16 MED ORDER — NITROGLYCERIN 0.4 MG SL SUBL
.4 mg | SUBLINGUAL | 0 refills | Status: DC | PRN
Start: 2023-07-16 — End: 2023-07-20
  Administered 2023-07-18: 15:00:00 0.4 mg via SUBLINGUAL

## 2023-07-16 MED ORDER — HYOSCYAMINE SULFATE 0.125 MG PO TBDI
.125 mg | SUBLINGUAL | 0 refills | Status: DC | PRN
Start: 2023-07-16 — End: 2023-07-17
  Administered 2023-07-16 – 2023-07-17 (×3): 0.125 mg via SUBLINGUAL

## 2023-07-16 MED ORDER — FUROSEMIDE 10 MG/ML IJ SOLN
10 mg | Freq: Once | INTRAVENOUS | 0 refills | Status: CP
Start: 2023-07-16 — End: ?
  Administered 2023-07-16: 19:00:00 10 mg via INTRAVENOUS

## 2023-07-16 MED ORDER — ATORVASTATIN 40 MG PO TAB
80 mg | Freq: Every day | ORAL | 0 refills | Status: DC
Start: 2023-07-16 — End: 2023-07-20
  Administered 2023-07-17 – 2023-07-20 (×4): 80 mg via ORAL

## 2023-07-16 MED ORDER — OXYCODONE 5 MG PO TAB
5-10 mg | ORAL | 0 refills | Status: DC | PRN
Start: 2023-07-16 — End: 2023-07-17
  Administered 2023-07-17 (×2): 5 mg via ORAL

## 2023-07-16 MED ORDER — ALBUTEROL SULFATE 90 MCG/ACTUATION IN HFAA
2 | RESPIRATORY_TRACT | 0 refills | Status: DC | PRN
Start: 2023-07-16 — End: 2023-07-20

## 2023-07-16 MED ORDER — FENTANYL CITRATE (PF) 50 MCG/ML IJ SOLN
25-75 ug | INTRAVENOUS | 0 refills | Status: DC | PRN
Start: 2023-07-16 — End: 2023-07-16

## 2023-07-16 MED ORDER — FENTANYL CITRATE (PF) 50 MCG/ML IJ SOLN
25-50 ug | INTRAVENOUS | 0 refills | Status: DC | PRN
Start: 2023-07-16 — End: 2023-07-17

## 2023-07-16 MED ORDER — POTASSIUM CHLORIDE 20 MEQ PO TBTQ
60 meq | Freq: Two times a day (BID) | ORAL | 0 refills | Status: DC
Start: 2023-07-16 — End: 2023-07-20
  Administered 2023-07-17 – 2023-07-20 (×8): 60 meq via ORAL

## 2023-07-16 MED ORDER — BUPIVACAINE HCL 0.5 % (5 MG/ML) IJ SOLN
0 refills | Status: DC
Start: 2023-07-16 — End: 2023-07-16

## 2023-07-16 MED ORDER — INSULIN NPH ISOPH U-100 HUMAN 100 UNIT/ML SC SUSP
28 [IU] | Freq: Once | SUBCUTANEOUS | 0 refills | Status: AC
Start: 2023-07-16 — End: ?

## 2023-07-16 NOTE — Unmapped
Brief Operative Note    Name: Ian Velez is a 83 y.o. male     DOB: July 28, 1940             MRN#: [id number]  DATE OF OPERATION: 07/16/2023    Date:  07/16/2023        Preoperative Dx:   Cancer of overlapping sites of bladder (HCC) [C67.8]    Post-op Diagnosis      * Cancer of overlapping sites of bladder (HCC) [C67.8]    Procedure(s):  URETHROTOMY/ URETHROSTOMY PERINEAL URETHRA - EXTERNAL  CYSTOURETHROSCOPY WITH FULGURATION/ RESECTION BLADDER TUMOR -Medium, 2-5cm  Surgeons and Role:     * Pamelia Botto, Ranelle Oyster, MD - Primary     * Orlene Och, MD - Fellow    Operative site infection: No    Findings:    Right lateral wall tumor, 2.5cm, resected  Posterior wall blue light positive area, no abnormality on white light, resected     Estimated Blood Loss: 20mL    Specimen(s) Removed/Disposition:   ID Type Source Tests Collected by Time Destination   1 : Right lateral wall tumor Tissue Bladder SURGICAL PATHOLOGY Marylen Ponto, MD 07/16/2023 1002    2 : Posterior bladder wall tumor Tissue Bladder SURGICAL PATHOLOGY Marylen Ponto, MD 07/16/2023 1007        Complications:  None    Implants: * No implants in log *    Drains: Details on drains available on LDA report    Disposition:  PACU - stable    Marylen Ponto, MD  Pager

## 2023-07-16 NOTE — H&P (View-Only)
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) with recent gross hematuria and evidence of recurrence s/p TURBT (04/25/23 with HG Ta, no muscle in specimen), hx of extremely elevated bladder neck requiring perineal urethrotomy TUR, buried penis, and irritative voiding symptoms who presents for blue light cystoscopy with transurethral resection of bladder tumor and perineal urethrostomy    PLAN:  - To OR for blue light cystoscopy with transurethral resection of bladder tumor and perineal urethrostomy  Consent obtained and in chart  Marking: N/A  - Antibiotic ppx: Ancef  - Requests for anesthesia: general      Surgical planning directed by Dr. Mosetta Anis, M.D.  Urology Resident  __________________________________________________________________________________    HPI: Hillis Range is a 83 y.o. 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 They deny any current symptoms.     Current anticoagulation: Lovenox bridge took this AM, holding xarelto ASA 81 for 1 week    I reviewed the procedural plan, risks and benefits and confirmed his informed consent document and discussion.      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    DM (diabetes mellitus), type 2 (HCC)    Elevated PSA    Facial basal cell cancer    Former cigarette smoker    High cholesterol    History of bladder carcinoma    History of blood transfusion    HX: anticoagulation    Hyperlipidemia    Hypertension    Hypoxemia    Infection    Low back pain    Lumbosacral spondylolysis    Lung disease    Multiple myeloma and immunoproliferative neoplasms (HCC)    Myocardial infarction (HCC)    OA (osteoarthritis)    Obesity    Obesity, Class III, BMI 40-49.9 (morbid obesity) (HCC)    Obstructive sleep apnea    On home oxygen therapy    Peripheral artery disease (HCC)    Pulmonary emphysema (HCC)    Pulmonary hypertension (HCC)    Skin cancer    Sleep apnea    Spinal stenosis of lumbar region    Stage 3 severe COPD by GOLD classification (HCC)    Unstable angina (HCC)    Vision decreased       Surgical History:   Procedure Laterality Date  LEG SURGERY  07/04/1975    left leg rapri    BLADDER SURGERY  2000    Tumor and 2001    COLONOSCOPY  2008    HX SKIN RESECTION  2009    basal cell ca removed of left cheek    CARDIAC CATHERIZATION  08/24/2017    stent x1    ANGIOGRAPHY CORONARY ARTERY WITH LEFT HEART CATHETERIZATION N/A 11/07/2017    Performed by Greig Castilla, MD at [facility] CATH LAB    POSSIBLE PERCUTANEOUS CORONARY STENT PLACEMENT WITH ANGIOPLASTY N/A 11/07/2017    Performed by Greig Castilla, MD at [facility] CATH LAB    BLADDER SURGERY  04/2023    TURBT    CIRCUMCISION      HX ADENOIDECTOMY      HX CORONARY STENT PLACEMENT  11/07/2017    HX CYSTECTOMY      PARTIAL CYSTECTOMY    HX TONSILLECTOMY      HX VASECTOMY      ROTATOR CUFF REPAIR Left     SKIN CANCER EXCISION      several TONSILLECTOMY      WISDOM TEETH EXTRACTION         Family History   Problem Relation Name Age of Onset    Heart Failure Mother Verdon Cummins     Diabetes Mother Verdon Cummins     Heart Attack Father      Heart Attack Sister        Reviewed; non-contributory    Social History     Tobacco Use    Smoking status: Former     Current packs/day: 0.00     Average packs/day: 2.0 packs/day for 50.0 years (100.0 ttl pk-yrs)     Types: Cigarettes     Start date: 07/12/1958     Quit date: 07/12/2008     Years since quitting: 15.0    Smokeless tobacco: Never   Vaping Use    Vaping status: Never Used   Substance and Sexual Activity    Alcohol use: Not Currently     Alcohol/week: 4.0 standard drinks of alcohol     Types: 4 Cans of beer per week     Comment: per week    Drug use: Never    Sexual activity: Not Currently        Immunizations (includes history and patient reported):   Immunization History   Administered Date(s) Administered    COVID-19 (MODERNA), mRNA vacc, 100 mcg/0.5 mL (PF) 08/20/2019, 09/17/2019, 05/12/2020    Covid-19 Bivalent (4yr+)(MODERNA), mRNA Vacc, 50 mcg/0.5 mL (PF) 04/12/2021    Covid-19 mRNA Vaccine >=12yo (Moderna)(Spikevax) 06/19/2022, 04/04/2023           Allergies:  Iodinated contrast media and Telepaque [iopanoic acid]    Medications:  Medications Prior to Admission   Medication Sig    acetaminophen SR (TYLENOL 8 HOUR) 650 mg tablet Take one tablet by mouth as Needed for Pain.    albuterol 0.5% (PROVENTIL; VENTOLIN) 2.5 mg/0.5 mL nebulizer solution Inhale one each solution by nebulizer as directed as Needed for Shortness of Breath or Wheezing.    albuterol sulfate (PROAIR HFA) 90 mcg/actuation HFA aerosol inhaler Inhale two puffs by mouth into the lungs as Needed for Wheezing or Shortness of Breath.    aspirin EC (ASPIR-LOW) 81 mg tablet Take one tablet by mouth daily.    atorvastatin (LIPITOR) 80 mg tablet TAKE ONE TABLET BY MOUTH DAILY    blood-glucose sensor (FREESTYLE LIBRE 3 SENSOR) sensor device Use one each as directed  every 14 days.    calcium carbonate-vitamin D3 (OS-CAL 500 + D) 1250 mg/200 unit tablet Take one tablet by mouth at bedtime daily. Calcium Carb 1250mg  delivers 500mg  elemental Ca    cyanocobalamin (vitamin B-12) 1,000 mcg tablet Take one tablet by mouth daily.    dapagliflozin propanediol (FARXIGA) 10 mg tablet Take one tablet by mouth daily.    doxycycline hyclate (VIBRACIN) 100 mg tablet Take one tablet by mouth twice daily.    elderberry fruit 350 mg cap Take one capsule by mouth daily.    enoxaparin (LOVENOX) 120 mg/ 0.8 mL syringe Inject 0.8 mL under the skin every 12 hours. Use for 2 days while holding Xarelto for procedure.    HYDROcodone/acetaminophen (NORCO) 5/325 mg tablet Take one tablet by mouth as Needed for Pain.    hydrocortisone (HYTONE) 2.5 % topical cream Apply  topically to affected area as Needed.    insulin aspart 70/30 (+) (NOVOLOG MIX 70/30) 100 unit/mL (70-30) Inject eighty Units under the skin twice daily. Sometimes will drop evening dose to 60-70u if BG is low    isosorbide mononitrate ER (IMDUR) 30 mg tablet,extended release 24 hr Take one tablet by mouth every morning.    magnesium oxide (PHILLIPS) 500 mg magnesium tablet Take one tablet by mouth at bedtime daily.    metFORMIN (GLUCOPHAGE) 500 mg tablet Take one tablet by mouth twice daily with meals. Hold 48 hours after cath procedure.  Resume with evening dose on 11/09/17    metoprolol succinate XL (TOPROL XL) 50 mg extended release tablet Take one tablet by mouth daily. Indications: .    montelukast (SINGULAIR) 10 mg tablet Take one tablet by mouth at bedtime daily.    multivitamin (ONE-A-DAY) tablet Take one tablet by mouth daily.    nitroglycerin (NITROSTAT) 0.4 mg tablet Place one tablet under tongue every 5 minutes as needed for Chest Pain. Max of 3 tablets, call 911.    potassium chloride SR (K-DUR) 20 mEq tablet Take three tablets by mouth twice daily. Take with a meal and a full glass of water.  Indications: prevention of low potassium in the blood (Patient taking differently: Take two tablets by mouth three times daily. Take with a meal and a full glass of water.  Indications: prevention of low potassium in the blood)    rivaroxaban (XARELTO) 20 mg tablet Take one tablet by mouth daily. Take with food.    spironolactone (ALDACTONE) 25 mg tablet TAKE 1 TABLET BY MOUTH EVERY DAY, TAKE WITH FOOD    SYMBICORT 80-4.5 mcg/actuation aerosol inhaler Inhale 2 PUFFS IN THE morning AND AT bedtime. RINSE MOUTH AND THROAT AFTER USE TO REDUCE AFTERTASTE AND INCIDENCE OF CANDIDIASIS. DO NOT SWALLOW MOUTH WITH WATER AFTER USE.    tiotropium bromide (SPIRIVA) 18 mcg capsule for inhaler Place one capsule into inhaler and inhale into lungs as directed daily.    tiZANidine (ZANAFLEX) 2 mg tablet Take one tablet by mouth every 8 hours as needed. Indications: muscle spasm (Patient taking differently: Take one tablet by mouth as Needed. Indications: muscle spasm)    torsemide (DEMADEX) 20 mg tablet Take 60mg  twice daily. Take by mouth.  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   HEENT: Normocephalic and atraumatic.   Cardiovascular: Normal rate   Pulmonary/Chest: Effort normal. No respiratory distress. Symmetric chest rise.  Abdominal: Soft. No distension or tenderness.   Psychiatric: Judgment and thought content normal.  Neurologic: No obvious neurologic deficits.  Musculoskeletal: No deformities.       Lab/Radiology/Other Diagnostic Tests:  No results for input(s): HGB, HCT, WBC, PLTCT, NA, K, CL, CO2, BUN, CR, GLU, CA, MG, PO4, ALBUMIN, TOTPROT, TOTBILI, AST, ALT, ALKPHOS, AMY, LIPASE, PREALB, INR, PT, PTT in the last 72 hours.       No orders to display

## 2023-07-17 LAB — POC GLUCOSE
~~LOC~~ BKR POC GLUCOSE: 388 mg/dL — ABNORMAL HIGH (ref 70–100)
~~LOC~~ BKR POC GLUCOSE: 379 mg/dL — ABNORMAL HIGH (ref 70–100)
~~LOC~~ BKR POC GLUCOSE: 398 mg/dL — ABNORMAL HIGH (ref 70–100)
~~LOC~~ BKR POC GLUCOSE: 210 mg/dL — ABNORMAL HIGH (ref 70–100)
~~LOC~~ BKR POC GLUCOSE: 269 mg/dL — ABNORMAL HIGH (ref 70–100)

## 2023-07-17 LAB — CBC
~~LOC~~ BKR HEMATOCRIT: 30 % — ABNORMAL LOW (ref 40.0–50.0)
~~LOC~~ BKR HEMOGLOBIN: 10 g/dL — ABNORMAL LOW (ref 13.5–16.5)
~~LOC~~ BKR MCH: 32 pg (ref 26.0–34.0)
~~LOC~~ BKR MCH: 33 pg (ref 26.0–34.0)
~~LOC~~ BKR MCHC: 33 g/dL (ref 32.0–36.0)
~~LOC~~ BKR MCHC: 34 g/dL (ref 32.0–36.0)
~~LOC~~ BKR MCV: 97 fL — ABNORMAL HIGH (ref 80.0–100.0)
~~LOC~~ BKR MCV: 95 fL (ref 80.0–100.0)
~~LOC~~ BKR MPV: 8.7 fL (ref 7.0–11.0)
~~LOC~~ BKR MPV: 8.2 fL (ref 7.0–11.0)
~~LOC~~ BKR PLATELET COUNT: 182 10*3/uL (ref 150–400)
~~LOC~~ BKR PLATELET COUNT: 196 10*3/uL (ref 150–400)
~~LOC~~ BKR RBC COUNT: 3.4 10*6/uL — ABNORMAL LOW (ref 4.40–5.50)
~~LOC~~ BKR RBC COUNT: 3.2 10*6/uL — ABNORMAL LOW (ref 4.40–5.50)
~~LOC~~ BKR RDW: 16 % — ABNORMAL HIGH (ref 11.0–15.0)
~~LOC~~ BKR RDW: 15 % — ABNORMAL HIGH (ref 11.0–15.0)
~~LOC~~ BKR WBC COUNT: 14 10*3/uL — ABNORMAL HIGH (ref 4.50–11.00)
~~LOC~~ BKR WBC COUNT: 15 10*3/uL — ABNORMAL HIGH (ref 4.50–11.00)

## 2023-07-17 LAB — BASIC METABOLIC PANEL
~~LOC~~ BKR ANION GAP: 6 (ref 3–12)
~~LOC~~ BKR BLD UREA NITROGEN: 16 mg/dL (ref 7–25)
~~LOC~~ BKR CALCIUM: 8.3 mg/dL — ABNORMAL LOW (ref 8.5–10.6)
~~LOC~~ BKR CHLORIDE: 94 mmol/L — ABNORMAL LOW (ref 98–110)
~~LOC~~ BKR CHLORIDE: 98 mmol/L (ref 98–110)
~~LOC~~ BKR CO2: 31 mmol/L — ABNORMAL HIGH (ref 21–30)
~~LOC~~ BKR CREATININE: 0.9 mg/dL (ref 0.40–1.24)
~~LOC~~ BKR GLOMERULAR FILTRATION RATE (GFR): >60 mL/min (ref >60)
~~LOC~~ BKR GLOMERULAR FILTRATION RATE (GFR): >60 mL/min (ref >60)
~~LOC~~ BKR GLUCOSE, RANDOM: 204 mg/dL — ABNORMAL HIGH (ref 70–100)
~~LOC~~ BKR POTASSIUM: 4.4 mmol/L — ABNORMAL LOW (ref 3.5–5.1)
~~LOC~~ BKR POTASSIUM: 4.1 mmol/L (ref 3.5–5.1)
~~LOC~~ BKR SODIUM, SERUM: 135 mmol/L — ABNORMAL LOW (ref 137–147)

## 2023-07-17 MED ORDER — INSULIN NPH AND REGULAR HUMAN 100 UNIT/ML (70-30) SC INPN
20 [IU] | Freq: Two times a day (BID) | SUBCUTANEOUS | 0 refills | Status: DC
Start: 2023-07-17 — End: 2023-07-18
  Administered 2023-07-17: 17:00:00 20 [IU] via SUBCUTANEOUS

## 2023-07-17 MED ORDER — SODIUM CHLORIDE 0.9% IV SOLP
500 mL | INTRAVENOUS | 0 refills | Status: CP
Start: 2023-07-17 — End: ?
  Administered 2023-07-17: 18:00:00 500 mL via INTRAVENOUS

## 2023-07-17 MED ORDER — ONDANSETRON HCL (PF) 4 MG/2 ML IJ SOLN
8 mg | INTRAVENOUS | 0 refills | Status: DC | PRN
Start: 2023-07-17 — End: 2023-07-20
  Administered 2023-07-17 – 2023-07-18 (×2): 8 mg via INTRAVENOUS

## 2023-07-17 MED ORDER — SODIUM CHLORIDE 0.9% IV SOLP
500 mL | INTRAVENOUS | 0 refills | Status: CP
Start: 2023-07-17 — End: ?
  Administered 2023-07-17: 22:00:00 500 mL via INTRAVENOUS

## 2023-07-17 MED ORDER — ONDANSETRON HCL (PF) 4 MG/2 ML IJ SOLN
8 mg | INTRAVENOUS | 0 refills | Status: DC | PRN
Start: 2023-07-17 — End: 2023-07-17

## 2023-07-17 MED ORDER — SODIUM CHLORIDE 0.9% IV SOLP
INTRAVENOUS | 0 refills | Status: DC
Start: 2023-07-17 — End: 2023-07-17
  Administered 2023-07-17: 15:00:00 1000.0000 mL via INTRAVENOUS

## 2023-07-17 MED ORDER — INSULIN ASPART 100 UNIT/ML SC FLEXPEN
0-24 [IU] | Freq: Before meals | SUBCUTANEOUS | 0 refills | Status: DC
Start: 2023-07-17 — End: 2023-07-20
  Administered 2023-07-17: 13:00:00 16 [IU] via SUBCUTANEOUS

## 2023-07-17 MED ORDER — BISACODYL 10 MG RE SUPP
10 mg | Freq: Two times a day (BID) | RECTAL | 0 refills | Status: DC
Start: 2023-07-17 — End: 2023-07-20
  Administered 2023-07-17 – 2023-07-19 (×2): 10 mg via RECTAL

## 2023-07-17 MED ORDER — SODIUM CHLORIDE 0.9% IV SOLP
1000 mL | INTRAVENOUS | 0 refills | Status: DC
Start: 2023-07-17 — End: 2023-07-18
  Administered 2023-07-18: 02:00:00 1000 mL via INTRAVENOUS

## 2023-07-17 MED ORDER — MELATONIN 3 MG PO TAB
3 mg | Freq: Every evening | ORAL | 0 refills | Status: DC
Start: 2023-07-17 — End: 2023-07-20
  Administered 2023-07-18 – 2023-07-20 (×3): 3 mg via ORAL

## 2023-07-17 MED ORDER — ACETAMINOPHEN 325 MG PO TAB
650 mg | ORAL | 0 refills | Status: DC | PRN
Start: 2023-07-17 — End: 2023-07-20
  Administered 2023-07-18 – 2023-07-20 (×7): 650 mg via ORAL

## 2023-07-17 NOTE — Progress Notes
RT Adult Assessment Note    NAME:Ian Velez             MRN: [id number]             DOB:1940/09/03          AGE: 83 y.o.  ADMISSION DATE: 07/16/2023             DAYS ADMITTED: LOS: 0 days    Additional Comments:  Impressions of the patient: Pt laying in bed on RA, NAD. Pt has hx of OSA and COPD. Trilogy QHS with 3lpm bleed-in.   Intervention(s)/outcome(s): Home regime includes Symbicort, Spiriva.  Patient education that was completed: None.  Recommendations to the care team: None.     Vital Signs:  Pulse: 112  RR: 20 PER MINUTE  SpO2: 94 %  O2 Device: CPAP/BiPAP  Liter Flow:    O2%:      Breath Sounds: Decreased     Respiratory Effort: Unlabored     Comments:

## 2023-07-17 NOTE — Progress Notes
Urology Progress Note 07/17/2023     Assessment:  Ian Velez is a 83 y.o. 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      LOS: 0 days     Principal Problem:    Neoplasm of bladder    Plan: Will discuss plan with staff surgeon - Dr. Oren Bracket     - PTA medications as appropriate   - Pain: PO pain control, IV pain medication for breakthrough  - Diet/FEN: NPO pending discussion with staff, mIVF  - Endo: [facility]  - GI: Bowel regimen. Anti-emetics prn  - GU: Will maintain foley catheter x 2 weeks on discharge   > Continue CBI this morning, maintain traction  - Renal: Cr 1.03   - Heme/ID: Afebrile. Hgb 11.0. WBC 14.5.     > Abx: Vibracin   - Encourage OOB/Ambulation in hallways  - PPx: SCD's, chemoprophylaxis with lovenox (held 1/14 AM). (ASA81mg  held)     Disposition: Continue inpatient care    Faye Ramsay, MD  Urology Resident  Please page Urology on-call with questions  ________________________________________________________________________   SUBJECTIVE:     CBI remained on full drip overnight and was manually irrigated twice by nursing. Pain is controlled on the current regimen. Tolerated diet without nausea or emesis. Patient has not yet ambulated.    OBJECTIVE:                   Vital Signs: Most Recent                Vital Signs: Past 24 Hours   BP: 100/51 (01/14 0401)  Temp: 36.3 ?C (97.3 ?F) (01/14 0401)  Pulse: 102 (01/14 0401)  Respirations: 20 PER MINUTE (01/14 0401)  SpO2: 95 % (01/14 0401)  O2 Device: None (Room air) (01/14 0401)  O2 Liter Flow: 3 Lpm (01/13 1345)  Height: 167.6 cm (5' 5.98) (01/13 0710)  Weight: 115.2 kg (254 lb) (01/13 0710)  BP: (95-143)/(51-100)   Temp:  [36.2 ?C (97.2 ?F)-36.7 ?C (98.1 ?F)]   Pulse:  [72-119]   Respirations:  [16 PER MINUTE-30 PER MINUTE]   SpO2:  [92 %-100 %]   O2 Device: None (Room air)  O2 Liter Flow: 3 Lpm       General: No acute distress  Pulm: Non-labored on room air  CV: Tachycardic rate  Abd: Soft, non-tender, non-distended  GU: 22Fr 3-way catheter via urethral meatus in place draining medium pink urine with CBI on fast drip, placed on manual traction this morning on rounds. Perineal urethrostomy site sewn    Date 07/16/23 0701 - 07/17/23 0700 07/17/23 0701 - 07/18/23 0700   Shift 0701-1900 1901-0700 24 Hour Total 0701-1900 1901-0700 24 Hour Total   INTAKE   P.O. 240  240      I.V.(mL/kg/hr) 500(0.4)  500      Other 6240 42030 48270      I.V.P.B./FLUSHES 100  100      Shift Total(mL/kg) 8756(43.3) 29518(841.6) 60630(160.1)      OUTPUT   Urine(mL/kg/hr) 2300(1.7)  2300        Urine Output (mL) ([REMOVED] Indwelling Urinary Catheter Standard 2-way) 2300  2300  Other 5350 45730 [id number]        Urine Drain Irrigant/Flush Out (mL) (Indwelling Urinary Catheter 24 FR 3-way) 5260 45730 45409        Urine Drain Irrigant/Flush Out (mL) ([REMOVED] Indwelling Urinary Catheter Standard 2-way) 90  90      Shift Total(mL/kg) 8119(14.7) 82956(213.0) 86578(469.6)      NET -570 -3700 -4270      Weight (kg) 115.2 115.2 115.2 115.2 115.2 115.2       Labs:                     Hematology                               Chemistry   Recent Labs     07/17/23  0604   WBC 14.50*   HGB 11.0*   PLTCT 182    No results for input(s): NA, K, CL, CO2, BUN, CR, GFR, GLU, CA, PO4 in the last 72 hours.    Invalid input(s): MAG     Malnutrition Details:                                                     Active Wounds                               ACTIVE PROBLEMS:  Principal Problem:    Neoplasm of bladder

## 2023-07-17 NOTE — Progress Notes
Pharmacy High Risk Medication Review    A high risk medication review has been performed for Ian Velez by a pharmacy team member.      Medication class(es) to review on inpatient med list:  None Identified      The medications class(es) are noted to be high risk to mobility or mentation in patients >82 years of age. If your patient has been CAM positive (diagnosed with delirium) at any point during this hospitalization then it would be particularly helpful to review these medications. Each medication has its own unique risks and benefits to the patient, we ask that these medications are reviewed and considered for de-prescribing where appropriate.     Consider reviewing [email or web address] for anticholinergic burden calculation.  If you need further assistance identifying or de-prescribing, please contact pharmacy.      Current Inpatient Medications:  Scheduled Meds:[Held by Provider] aspirin EC (ASPIR-LOW) tablet 81 mg, 81 mg, Oral, QDAY  atorvastatin (LIPITOR) tablet 80 mg, 80 mg, Oral, QDAY  budesonide-formoterol HFA (SYMBICORT) 80-4.5 mcg/actuation inhalation 2 puff, 2 puff, Inhalation, Q12H  calcium carbonate-vitamin D3 (OS-CAL 500 + D) 1250 mg/200 unit tablet 1 tablet, 1 tablet, Oral, QHS  cyanocobalamin (vitamin B-12) tablet 1,000 mcg, 1,000 mcg, Oral, QDAY  dapagliflozin propanediol (FARXIGA) tablet 10 mg, 10 mg, Oral, QDAY  doxycycline hyclate (VIBRACIN) tablet 100 mg, 100 mg, Oral, BID  [Held by Provider] enoxaparin (LOVENOX) syringe 40 mg, 40 mg, Subcutaneous, BID  insulin aspart (U-100) (NOVOLOG FLEXPEN U-100 INSULIN) injection PEN 0-24 Units, 0-24 Units, Subcutaneous, ACHS (22)  insulin NPH-REG 70-30 (NOVOLIN 70-30 FLEXPEN U-100 INSULIN) injection PEN 20 Units, 20 Units, Subcutaneous, BID before meals  isosorbide mononitrate ER (IMDUR) tablet 30 mg, 30 mg, Oral, QAM8  magnesium oxide (MAGOX) tablet 600 mg, 600 mg, Oral, QHS  metFORMIN (GLUCOPHAGE) tablet 500 mg, 500 mg, Oral, BID w/meals  metoprolol succinate XL (TOPROL XL) tablet 50 mg, 50 mg, Oral, QDAY  montelukast (SINGULAIR) tablet 10 mg, 10 mg, Oral, QHS  multivitamin (ONE-A-DAY) tablet 1 tablet, 1 tablet, Oral, QDAY  polyethylene glycol 3350 (MIRALAX) packet 17 g, 1 packet, Oral, BID  potassium chloride SR (K-DUR) tablet 60 mEq, 60 mEq, Oral, BID w/meals  senna (SENOKOT) tablet 1 tablet, 1 tablet, Oral, BID  spironolactone (ALDACTONE) tablet 25 mg, 25 mg, Oral, QDAY  tiotropium bromide (SPIRIVA RESPIMAT) inhaler 2 puff, 2 puff, Inhalation, QDAY  torsemide (DEMADEX) tablet 60 mg, 60 mg, Oral, BID(9-17)    Continuous Infusions:   sodium chloride 0.9 %   infusion 100 mL/hr at 07/17/23 0833    sodium chloride 0.9 % irrigation bag 3,000 mL       PRN and Respiratory Meds:acetaminophen Q4H PRN, albuterol sulfate PRN, dextrose 50% (D50) IV PRN, nitroglycerin Q5 MIN PRN        Thank you,  Lucienne Minks, [facility]  07/17/2023

## 2023-07-18 ENCOUNTER — Encounter: Admit: 2023-07-18 | Discharge: 2023-07-18 | Payer: MEDICARE

## 2023-07-18 LAB — POC GLUCOSE
~~LOC~~ BKR POC GLUCOSE: 294 mg/dL — ABNORMAL HIGH (ref 70–100)
~~LOC~~ BKR POC GLUCOSE: 210 mg/dL — ABNORMAL HIGH (ref 70–100)
~~LOC~~ BKR POC GLUCOSE: 214 mg/dL — ABNORMAL HIGH (ref 70–100)
~~LOC~~ BKR POC GLUCOSE: 224 mg/dL — ABNORMAL HIGH (ref 70–100)
~~LOC~~ BKR POC GLUCOSE: 312 mg/dL — ABNORMAL HIGH (ref 70–100)

## 2023-07-18 LAB — CBC
~~LOC~~ BKR HEMATOCRIT: 29 % — ABNORMAL LOW (ref 40.0–50.0)
~~LOC~~ BKR MCH: 32 pg (ref 26.0–34.0)
~~LOC~~ BKR MCHC: 33 g/dL (ref 32.0–36.0)
~~LOC~~ BKR MCV: 97 fL — ABNORMAL HIGH (ref 80.0–100.0)
~~LOC~~ BKR MPV: 8.2 fL (ref 7.0–11.0)
~~LOC~~ BKR PLATELET COUNT: 193 10*3/uL (ref 150–400)
~~LOC~~ BKR RBC COUNT: 3 10*6/uL — ABNORMAL LOW (ref 4.40–5.50)
~~LOC~~ BKR RDW: 16 % — ABNORMAL HIGH (ref 11.0–15.0)
~~LOC~~ BKR WBC COUNT: 16 10*3/uL — ABNORMAL HIGH (ref 4.50–11.00)

## 2023-07-18 LAB — ECG 12-LEAD
Q-T INTERVAL: 392 ms
QRS DURATION: 156 ms
QTC CALCULATION (BAZETT): 441 ms
R AXIS: -75 degrees
T AXIS: -2 degrees
VENTRICULAR RATE: 76 {beats}/min

## 2023-07-18 LAB — BASIC METABOLIC PANEL: ~~LOC~~ BKR POTASSIUM: 5.1 mmol/L — ABNORMAL LOW (ref 3.5–5.1)

## 2023-07-18 MED ORDER — RANOLAZINE 500 MG PO TB12
500 mg | ORAL_TABLET | Freq: Two times a day (BID) | ORAL | 1 refills | Status: AC
Start: 2023-07-18 — End: ?
  Filled 2023-07-20: qty 180, 90d supply, fill #1

## 2023-07-18 MED ORDER — HYOSCYAMINE SULFATE 0.125 MG PO TBDI
.125 mg | SUBLINGUAL | 0 refills | Status: DC | PRN
Start: 2023-07-18 — End: 2023-07-20
  Administered 2023-07-18 – 2023-07-20 (×9): 0.125 mg via SUBLINGUAL

## 2023-07-18 MED ORDER — INSULIN NPH AND REGULAR HUMAN 100 UNIT/ML (70-30) SC INPN
30 [IU] | Freq: Two times a day (BID) | SUBCUTANEOUS | 0 refills | Status: DC
Start: 2023-07-18 — End: 2023-07-19

## 2023-07-18 MED ORDER — RANOLAZINE 500 MG PO TB12
500 mg | Freq: Two times a day (BID) | ORAL | 0 refills | Status: DC
Start: 2023-07-18 — End: 2023-07-20
  Administered 2023-07-19 – 2023-07-20 (×4): 500 mg via ORAL

## 2023-07-18 MED ORDER — DICLOFENAC SODIUM 1 % TP GEL
4 g | Freq: Four times a day (QID) | TOPICAL | 0 refills | Status: DC | PRN
Start: 2023-07-18 — End: 2023-07-20
  Administered 2023-07-19: 04:00:00 4 g via TOPICAL

## 2023-07-18 NOTE — Progress Notes
Night 1900-0700    Pain: Pain is controlled with current analgesics. Medications being used: Acetaminophen    Nutrition: Diet- Diet: NPO at Midnight    Activity: Ambulation Episodes: pt up to bathroom PRN throughout night.    Incentive Spirometer this shift: No  Max Volume: N/A    Acute Events, Nursing Intervention or Provider Communication: PRN medications administered per pt request throughout night. Pt NPO at midnight per orders. No acute events overnight.    Patient Interventions and Education  Fall Risk/JHFRAT  Fall Risk Score: Total Fall Risk Score: 7.    The following interventions were implemented: Remembering to call for assistance with ambulation, Assistance managing patient care equipment, Bed/chair alarm, Walking aides (gait belt, walker, cane, etc.), X1-2 person assist, Fall identifiers in place, and Standard mobility safety, patient Verbalizes Understanding and Demonstrated Understanding of fall risk factors and interventions. Patient compliance: Patient compliant with interventions.    Restraints: No  Restraints Goal: choice: N/A  See Docflowsheet for restraint documentation, interventions, education, etc.    Intake and Output:      Date 07/17/23 0701 - 07/18/23 0700 07/18/23 0701 - 07/19/23 0700   Shift 0701-1900 1901-0700 24 Hour Total 0701-1900 1901-0700 24 Hour Total   INTAKE   P.O.  200 200      Other 18000 6000 24000      Shift Total(mL/kg) 69629(528.4) 6200(53.8) 13244(010)      OUTPUT   Other 29400 8350 37750        Stool Occurrence 1 x 2 x 3 x        Urine Drain Irrigant/Flush Out (mL) (Indwelling Urinary Catheter 24 FR 3-way) 27253 8350 37750      Shift Total(mL/kg) 66440(347.4) 2595(63.8) 75643(329.5)      NET -11400 -2150 -13550      Weight (kg) 115.2 115.2 115.2 115.2 115.2 115.2         This RN provided education to patient. The following education topics were reviewed with patient: Medication Tylenol, Ducolax, calcium, doxycycline, insulin, magnesium, melatonin, Singulair, Zofran, Miralax, senna, Anaspaz  Patient Verbalizes Understanding and Demonstrated Understanding     Discharge Plan: Discharge Planning: Patient has a ride

## 2023-07-18 NOTE — Case Management (ED)
Discharge Planning Screen      Primary Case Management Team:   Social Work: Kinnie Scales, Phone [phone] (or Amie Critchley)  Nurse Case Manager: Carmelia Bake, Phone [phone] (or Amie Critchley)  After-Hours: Please dial hospital operator and request On-Call Case Manager    Initial Huddle Date or Communication with Physician Team: [date]1.14.25  Expected Discharge Date: 07/19/2023   Is Patient Medically Stable: No, Please explain: pending final AC plan  -------------------------------------------------------------------------------------------------------------------------    83 y.o. 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     Using sources of EMR, RN staff, and/or huddle discussion and the criteria below, patient was screened for potential discharge planning needs:    Team anticipates post-acute care needs that will exceed patient/caregiver capacity to arrange or manage independently: No  If Yes, full CM assessment is required.    Prior to this episode of care (including at outside hospital), patient's residence was: Family home    Patient is alert and oriented, and likely to remain that way at discharge: Yes   If No, patient  (a) Is at cognitive baseline, (b) has an established caregiver, and (c) home routine is unlikely to change at discharge: N/A    If any answer to (a), (b), or (c) is No, full assessment is required.    Patient is independent in functioning, and likely to remain that way at discharge: Yes   If No, patient   (a) Is at functional baseline, (b) has an established caregiver or adequate assistance, and (c) home routine is unlikely to change at discharge: N/A  If any answer to (a), (b), or (c) is No, full assessment is required.    PSYCHOSOCIAL NEEDS:  If the patient has unstable psychosocial needs and has not had full CM assessment in the past 30 days, full assessment is required.    CM spoke with or attempted to speak with patient today to determine willingness to address behavioral/social/domestic need: N/A  Outcomes of intervention:   The patient is open to addressing these needs during this admission:  N/A .   Further results were: N/A.    Other Case Management Notes: No needs anticipated for discharge    Plan: Likely discharge to home without Case Management needs. Case Management Team will continue to monitor daily for development of needs. Should Case Management needs arise, please call or Voalte Case Management Team noted above. If after hours or on the weekend, please dial hospital operator and request the on-call Case Manager.    Carmelia Bake, RN

## 2023-07-18 NOTE — Progress Notes
Day 0700-1900    Pain: Pain is controlled with current analgesics. Medications being used: Acetaminophen    Nutrition: Diet- Diet: Diabetic    Activity: Ambulation Episodes: walked to and from bathroom multiple times    Incentive Spirometer this shift: Yes  Max Volume: 1500    Acute Events, Nursing Intervention or Provider Communication: pt bp was 80/50s, (2) 500 boluses ordered this shift, pt a little delirious seeing ants and spiders on the wall. Delirium protocol implemented.     Patient Interventions and Education  Fall Risk/JHFRAT  Fall Risk Score: Total Fall Risk Score: 12.    The following interventions were implemented: Remembering to call for assistance with ambulation, Communicate recent fall and/or risk of harm, Assistance managing patient care equipment, Bed/chair alarm, Walking aides (gait belt, walker, cane, etc.), Toileting schedule every 1 hours, 2 person assist, Fall identifiers in place, and Standard mobility safety, patient and family Verbalizes Understanding and Demonstrated Understanding of fall risk factors and interventions. Patient compliance: Patient compliant with interventions.    Restraints: No  Restraints Goal: choice: N/A  See Docflowsheet for restraint documentation, interventions, education, etc.    Intake and Output:      Date 07/16/23 0701 - 07/17/23 0700 07/17/23 0701 - 07/18/23 0700   Shift 0701-1900 1901-0700 24 Hour Total 0701-1900 1901-0700 24 Hour Total   INTAKE   P.O. 240  240      I.V.(mL/kg/hr) 500(0.4)  500(0.2)      Other 6240 42030 48270 18000  18000   I.V.P.B./FLUSHES 100  100      Shift Total(mL/kg) 5188(41.6) 60630(160.1) 09323(557.3) 22025(427.0)  62376(283.1)   OUTPUT   Urine(mL/kg/hr) 2300(1.7)  2300(0.8)        Urine Output (mL) ([REMOVED] Indwelling Urinary Catheter Standard 2-way) 2300  2300      Other 5350 45730 51080 51761  28800     Stool Occurrence    1 x  1 x     Urine Drain Irrigant/Flush Out (mL) (Indwelling Urinary Catheter 24 FR 3-way) 5260 45730 60737 10626  94854     Urine Drain Irrigant/Flush Out (mL) ([REMOVED] Indwelling Urinary Catheter Standard 2-way) 90  90      Shift Total(mL/kg) 6270(35.0) 09381(829.9) 37169(678.9) 38101(751)  02585(277)   NET -570 -3700 -4270 -10800  -10800   Weight (kg) 115.2 115.2 115.2 115.2 115.2 115.2         This RN provided education to patient and family. The following education topics were reviewed with patient: Catheter Patient Verbalizes Understanding     Discharge Plan: Discharge Planning: Patient has a ride

## 2023-07-18 NOTE — Progress Notes
Urology Progress Note 07/18/2023     Assessment:  Ian Velez is a 83 y.o. 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      LOS: 1 day     Principal Problem:    Neoplasm of bladder    Plan: Will discuss plan with staff surgeon - Dr. Oren Bracket     - PTA medications as appropriate   - Pain: PO pain control, d/c IV pain medication for breakthrough  - Diet/FEN: Diabetic diet, SLIVF  - Endo: HDCF  - GI: Bowel regimen. Anti-emetics prn  - GU: Will maintain foley catheter x 2 weeks on discharge   > CBI clamped on rounds 1/15  - Renal: Cr 0.95 (1.03)    - Heme/ID: Afebrile. Hgb 10.6 (11.0). WBC 15.8 (14.5).     > Abx: Vibracin   - Encourage OOB/Ambulation in hallways  - PPx: SCD's, chemoprophylaxis with lovenox currently held. (ASA81mg  held)     Disposition: Continue inpatient care    Faye Ramsay, MD  Urology Resident  Please page Urology on-call with questions  ________________________________________________________________________   SUBJECTIVE:     NAEO. Delirium yesterday, oxycodone discontinued. Levsin resumed. Pain is controlled on the current regimen. Tolerated diet without nausea or emesis. Patient has ambulated in halls. Having bowel movements.    OBJECTIVE:                   Vital Signs: Most Recent                Vital Signs: Past 24 Hours   BP: 90/56 (01/15 0321)  Temp: 36.4 ?C (97.6 ?F) (01/15 0321)  Pulse: 101 (01/15 0321)  Respirations: 18 PER MINUTE (01/14 2204)  SpO2: 93 % (01/15 0321)  O2 Device: CPAP/BiPAP (01/15 0321)  BP: (81-102)/(44-80)   Temp:  [36.3 ?C (97.3 ?F)-36.6 ?C (97.8 ?F)]   Pulse:  [90-109]   Respirations:  [18 PER MINUTE] SpO2:  [92 %-99 %]   O2 Device: CPAP/BiPAP       General: No acute distress  Pulm: Non-labored on CPAP   CV: Regular rate rate  Abd: Soft, non-tender, non-distended  GU: 22Fr 3-way catheter via urethral meatus in place draining clear pink urine with CBI on very slow drip, clamped on rounds. Perineal urethrostomy site sewn    Date 07/17/23 0701 - 07/18/23 0700 07/18/23 0701 - 07/19/23 0700   Shift 0701-1900 1901-0700 24 Hour Total 0701-1900 1901-0700 24 Hour Total   INTAKE   P.O.  200 200      Other 18000 6000 24000      Shift Total(mL/kg) 45409(811.9) 6200(53.8) 14782(956)      OUTPUT   Other 29400 8350 37750        Stool Occurrence 1 x 2 x 3 x        Urine Drain Irrigant/Flush Out (mL) (Indwelling Urinary Catheter 24 FR 3-way) 21308 8350 37750      Shift Total(mL/kg) 65784(696.2) 9528(41.3) 24401(027.2)      NET -11400 -2150 -13550      Weight (kg) 115.2 115.2 115.2 115.2 115.2 115.2       Labs:  Hematology                               Chemistry   Recent Labs     07/18/23  0622   WBC 16.00*   HGB 10.0*   PLTCT 193    Recent Labs     07/17/23  1227   NA 135*   K 4.1   CL 98   CO2 31*   BUN 16   CR 0.95   GFR >60   GLU 204*   CA 8.3*        Malnutrition Details:                                                     Active Wounds                               ACTIVE PROBLEMS:  Principal Problem:    Neoplasm of bladder

## 2023-07-19 LAB — IRON + BINDING CAPACITY + %SAT+ FERRITIN
~~LOC~~ BKR % SATURATION: 8 % — ABNORMAL LOW (ref 28–42)
~~LOC~~ BKR FERRITIN: 28 ng/mL — ABNORMAL LOW (ref 30–300)
~~LOC~~ BKR IRON BINDING: 347 ug/dL (ref 270–380)
~~LOC~~ BKR IRON: 28 g/dL — ABNORMAL LOW (ref 50–185)
~~LOC~~ BKR TRANSFERRIN: 233 mg/dL (ref 185–336)

## 2023-07-19 LAB — POC GLUCOSE
~~LOC~~ BKR POC GLUCOSE: 282 mg/dL — ABNORMAL HIGH (ref 70–100)
~~LOC~~ BKR POC GLUCOSE: 183 mg/dL — ABNORMAL HIGH (ref 70–100)
~~LOC~~ BKR POC GLUCOSE: 216 mg/dL — ABNORMAL HIGH (ref 70–100)
~~LOC~~ BKR POC GLUCOSE: 342 mg/dL — ABNORMAL HIGH (ref 70–100)

## 2023-07-19 MED ORDER — INSULIN NPH AND REGULAR HUMAN 100 UNIT/ML (70-30) SC INPN
50 [IU] | Freq: Two times a day (BID) | SUBCUTANEOUS | 0 refills | Status: DC
Start: 2023-07-19 — End: 2023-07-20
  Administered 2023-07-19: 23:00:00 50 [IU] via SUBCUTANEOUS

## 2023-07-19 MED ORDER — BACITRACIN ZINC 500 UNIT/GRAM TP OINT
TOPICAL | 0 refills | Status: DC | PRN
Start: 2023-07-19 — End: 2023-07-20
  Administered 2023-07-19: 15:00:00 via TOPICAL

## 2023-07-19 MED ORDER — RIVAROXABAN 20 MG PO TAB
20 mg | Freq: Every day | ORAL | 0 refills | Status: DC
Start: 2023-07-19 — End: 2023-07-20
  Administered 2023-07-19 – 2023-07-20 (×2): 20 mg via ORAL

## 2023-07-19 NOTE — Progress Notes
Day 0700-1900    Pain: Pain is controlled with current analgesics. Medications being used: Acetaminophen and hycosamine    Nutrition: Diet- Diet: Diabetic    Activity: Ambulation Episodes: 2 walks during shift    Incentive Spirometer this shift: Yes  Max Volume: 2000    Acute Events, Nursing Intervention or Provider Communication: CBI clamped today, pt complained of some chest discomfort with exertion, provider notified and nitroglycerin given. Foley video given with packet to wife and daughter.     Patient Interventions and Education  Fall Risk/JHFRAT  Fall Risk Score: Total Fall Risk Score: 6.    The following interventions were implemented: Standard mobility safety, patient and family Verbalizes Understanding and Demonstrated Understanding of fall risk factors and interventions. Patient compliance: Patient refused fall bundle with family  interventions.   Restraints: No  Restraints Goal: choice: N/A  See Docflowsheet for restraint documentation, interventions, education, etc.    Intake and Output:      Date 07/17/23 0701 - 07/18/23 0700 07/18/23 0701 - 07/19/23 0700   Shift 0701-1900 1901-0700 24 Hour Total 0701-1900 1901-0700 24 Hour Total   INTAKE   P.O.  200 200      Other 18000 6000 24000      Shift Total(mL/kg) 16109(604.5) 6200(53.8) 40981(191)      OUTPUT   Urine(mL/kg/hr)    3750  3750     Urine Output (mL) (Indwelling Urinary Catheter 24 FR 3-way)    3750  3750   Other 29400 8350 37750        Stool Occurrence 1 x 2 x 3 x        Urine Drain Irrigant/Flush Out (mL) (Indwelling Urinary Catheter 24 FR 3-way) 47829 8350 37750      Shift Total(mL/kg) 56213(086.5) 8350(72.5) 78469(629.5) 3750(32.5)  3750(32.5)   NET -11400 -2150 -13550 -3750  -3750   Weight (kg) 115.2 115.2 115.2 115.2 115.2 115.2         This RN provided education to patient and family. The following education topics were reviewed with patient: Catheter Patient Verbalizes Understanding and Demonstrated Understanding     Discharge Plan: Discharge Planning: Patient has a ride

## 2023-07-19 NOTE — Progress Notes
Day 0700-1900    Pain: Pain is controlled with current analgesics. Medications being used: Acetaminophen, voltaren, bacitracin     Nutrition: Diet- Diet: Diabetic    Activity: Ambulation Episodes: 2 walks during shift    Incentive Spirometer this shift: Yes  Max Volume: 2000    Acute Events, Nursing Intervention or Provider Communication: irrigated catheter and got a baby clot out, has been running light pink :)     Patient Interventions and Education  Fall Risk/JHFRAT  Fall Risk Score: Total Fall Risk Score: 9.    The following interventions were implemented: Standard mobility safety, patient and family Verbalizes Understanding and Demonstrated Understanding of fall risk factors and interventions. Patient compliance: Patient compliant with interventions.    Restraints: No  Restraints Goal: choice: N/A  See Docflowsheet for restraint documentation, interventions, education, etc.    Intake and Output:      Date 07/18/23 0701 - 07/19/23 0700 07/19/23 0701 - 07/20/23 0700   Shift 0701-1900 1901-0700 24 Hour Total 0701-1900 1901-0700 24 Hour Total   INTAKE   P.O.  600 600      Other 0 0 0 100  100   Shift Total(mL/kg) 0(0) 600(5.2) 600(5.2) 100(0.9)  100(0.9)   OUTPUT   Urine(mL/kg/hr) 5100(3.7) 1700(1.2) 6800(2.5) 3100  3100     Urine Output (mL) (Indwelling Urinary Catheter 24 FR 3-way) 5100 1700 6800 3100  3100   Other 0 0 0 200  200     Stool Occurrence  1 x 1 x        Urine Drain Irrigant/Flush Out (mL) (Indwelling Urinary Catheter 24 FR 3-way) 0 0 0 200  200   Shift Total(mL/kg) 5100(44.3) 1700(14.8) 6800(59) 3300(28.6)  3300(28.6)   NET -5100 -1100 -6200 -3200  -3200   Weight (kg) 115.2 115.2 115.2 115.2 115.2 115.2         This RN provided education to patient and family. The following education topics were reviewed with patient: Catheter Patient Verbalizes Understanding and Demonstrated Understanding     Discharge Plan: Discharge Planning: Patient has a ride

## 2023-07-19 NOTE — Progress Notes
Urology Progress Note 07/19/2023     Assessment:  Ian Velez is a 83 y.o. 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 Oren Bracket     - Cardiology consulted 1/15, appreciate anticoagulation recommendations   - PTA medications as appropriate   - Pain: PO pain control, d/c IV pain medication for breakthrough  - Diet/FEN: Diabetic diet, SLIVF  - Endo: HDCF  - GI: Bowel regimen. Anti-emetics prn  - GU: Will maintain foley catheter x 2 weeks on discharge   > CBI clamped on rounds 1/15  - Renal: Cr 1.28 (1.14)   - Heme/ID: Afebrile. Hgb 8.6 from 10.6 (11.0). WBC 14.2 from 16      > Abx: Vibracin   - Encourage OOB/Ambulation in hallways  - PPx: SCD's, chemoprophylaxis with lovenox (ASA81mg  held)     Disposition: Continue inpatient care    Andi Hence, MD  Urology Resident  Please page Urology on-call with questions  ________________________________________________________________________   SUBJECTIVE:     NAEO. Pain is controlled on the current regimen. Tolerated diet without nausea or emesis. Patient has ambulated in halls. Having bowel movements.    OBJECTIVE:                   Vital Signs: Most Recent                Vital Signs: Past 24 Hours   BP: 96/55 (01/16 0404)  Temp: 36.3 ?C (97.4 ?F) (01/16 0404)  Pulse: 92 (01/16 0404)  Respirations: 18 PER MINUTE (01/15 1229)  SpO2: 100 % (01/16 0404)  O2 Device: CPAP/BiPAP (01/16 0404)  O2 Liter Flow: 3 Lpm (01/15 2340)  BP: (94-126)/(41-78)   Temp:  [36.3 ?C (97.3 ?F)-36.8 ?C (98.3 ?F)]   Pulse:  [84-113]   Respirations:  [18 PER MINUTE]   SpO2:  [92 %-100 %]   O2 Device: CPAP/BiPAP  O2 Liter Flow: 3 Lpm       General: No acute distress  Pulm: Non-labored on CPAP   CV: Normal rate   Abd: Soft, non-tender, non-distended  GU: 22Fr 3-way catheter via urethral meatus in place draining clear amber urine [no CBI] Perineal urethrostomy site sewn    Date 07/18/23 0701 - 07/19/23 0700 07/19/23 0701 - 07/20/23 0700   Shift 0701-1900 1901-0700 24 Hour Total 0701-1900 1901-0700 24 Hour Total   INTAKE  P.O.  600 600      Other 0 0 0      Shift Total(mL/kg) 0(0) 600(5.2) 600(5.2)      OUTPUT   Urine(mL/kg/hr) 5100(3.7) 1700 6800        Urine Output (mL) (Indwelling Urinary Catheter 24 FR 3-way) 5100 1700 6800      Other 0 0 0        Stool Occurrence  1 x 1 x        Urine Drain Irrigant/Flush Out (mL) (Indwelling Urinary Catheter 24 FR 3-way) 0 0 0      Shift Total(mL/kg) 5100(44.3) 1700(14.8) 6800(59)      NET -5100 -1100 -6200      Weight (kg) 115.2 115.2 115.2 115.2 115.2 115.2       Labs:                     Hematology                               Chemistry   Recent Labs     07/19/23  0320   WBC 14.20*   HGB 8.6*   PLTCT 186    Recent Labs     07/19/23  0320   NA 136*   K 4.3   CL 94*   CO2 30   BUN 26*   CR 1.28*   GFR 56*   GLU 209*   CA 8.5        Malnutrition Details:                                                     Active Wounds                               ACTIVE PROBLEMS:  Principal Problem: Neoplasm of bladder  Active Problems:    Pulmonary hypertension (HCC)    Obstructive sleep apnea    OA (osteoarthritis)    Lumbosacral spondylolysis    Hypoxemia    Essential hypertension    Mixed hyperlipidemia    History of bladder carcinoma    DM (diabetes mellitus), type 2 (HCC)    Stage 3 severe COPD by GOLD classification (HCC)    Chronic atrial fibrillation (HCC)    Coronary artery disease of native artery of native heart with stable angina pectoris (HCC)    On home oxygen therapy    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

## 2023-07-19 NOTE — Progress Notes
Night 1900-0700    Pain: Pain is controlled with current analgesics. Medications being used: Acetaminophen and Voltaren gel    Nutrition: Diet- Diet: Diabetic    Activity: Ambulation Episodes: pt up to bathroom PRN throughout night with X1 assistance    Incentive Spirometer this shift: Yes  Max Volume: 2000    Acute Events, Nursing Intervention or Provider Communication: PRN pain medications administered per pt request throughout shift. No acute events overnight.    Patient Interventions and Education  Fall Risk/JHFRAT  Fall Risk Score: Total Fall Risk Score: 9.    The following interventions were implemented: Remembering to call for assistance with ambulation, Assistance managing patient care equipment, Bed/chair alarm, Walking aides (gait belt, walker, cane, etc.), X1 person assist, Fall identifiers in place, and Standard mobility safety, patient Verbalizes Understanding and Demonstrated Understanding of fall risk factors and interventions. Patient compliance: Patient compliant with interventions.    Restraints: No  Restraints Goal: choice: N/A  See Docflowsheet for restraint documentation, interventions, education, etc.    Intake and Output:      Date 07/18/23 0701 - 07/19/23 0700 07/19/23 0701 - 07/20/23 0700   Shift 0701-1900 1901-0700 24 Hour Total 0701-1900 1901-0700 24 Hour Total   INTAKE   P.O.  600 600      Other 0 0 0      Shift Total(mL/kg) 0(0) 600(5.2) 600(5.2)      OUTPUT   Urine(mL/kg/hr) 5100(3.7) 1700 6800        Urine Output (mL) (Indwelling Urinary Catheter 24 FR 3-way) 5100 1700 6800      Other 0 0 0        Stool Occurrence  1 x 1 x        Urine Drain Irrigant/Flush Out (mL) (Indwelling Urinary Catheter 24 FR 3-way) 0 0 0      Shift Total(mL/kg) 5100(44.3) 1700(14.8) 6800(59)      NET -5100 -1100 -6200      Weight (kg) 115.2 115.2 115.2 115.2 115.2 115.2         This RN provided education to patient. The following education topics were reviewed with patient: Medication Tylenol, Ducolax, calcium carbonate, Voltaren gel, doxycycline, Lovenox, Anaspaz, insulin, Magox, melatonin, Singulair, Miralax, Ranexa, senna  Patient Verbalizes Understanding     Discharge Plan: Discharge Planning: Patient has a ride

## 2023-07-19 NOTE — Progress Notes
Ian Velez    .  Discharge instructions reviewed with patient and family. Patient was able to watch the educational video created by [facility] RNs. It can be found at [email or web address]. The patient can access video upon DC if needed. Patient also given [employer] handout, Indwelling Foley Catheter + Leg Bag, with instructions on proper care of drain.     Supplies provided to patient upon discharge: Leg bag(s), extra overnight/large foley bag(s), graduated cylinder, Toomey syringe (2), alcohol wipes, STAT locks, sterile saline bottle (ONLY if the patient will need to flush their catheter). Items are supplied for short-term use upon discharge. If additional items are needed, it will be arranged by Case Management.     patient and family was able to voice understanding of dc instructions and complete return demonstration of site care, flushing of drain, emptying and changing bag, and measuring output correctly all while using clean technique.

## 2023-07-20 ENCOUNTER — Encounter: Admit: 2023-07-20 | Discharge: 2023-07-20 | Payer: MEDICARE

## 2023-07-20 DIAGNOSIS — Z888 Allergy status to other drugs, medicaments and biological substances status: Secondary | ICD-10-CM

## 2023-07-20 DIAGNOSIS — Z91041 Radiographic dye allergy status: Secondary | ICD-10-CM

## 2023-07-20 DIAGNOSIS — Z7982 Long term (current) use of aspirin: Secondary | ICD-10-CM

## 2023-07-20 DIAGNOSIS — E1122 Type 2 diabetes mellitus with diabetic chronic kidney disease: Secondary | ICD-10-CM

## 2023-07-20 DIAGNOSIS — Z6841 Body Mass Index (BMI) 40.0 and over, adult: Secondary | ICD-10-CM

## 2023-07-20 DIAGNOSIS — I130 Hypertensive heart and chronic kidney disease with heart failure and stage 1 through stage 4 chronic kidney disease, or unspecified chronic kidney disease: Secondary | ICD-10-CM

## 2023-07-20 DIAGNOSIS — Z7951 Long term (current) use of inhaled steroids: Secondary | ICD-10-CM

## 2023-07-20 DIAGNOSIS — Z9981 Dependence on supplemental oxygen: Secondary | ICD-10-CM

## 2023-07-20 DIAGNOSIS — I4821 Permanent atrial fibrillation: Secondary | ICD-10-CM

## 2023-07-20 DIAGNOSIS — R310 Gross hematuria: Secondary | ICD-10-CM

## 2023-07-20 DIAGNOSIS — G4733 Obstructive sleep apnea (adult) (pediatric): Secondary | ICD-10-CM

## 2023-07-20 DIAGNOSIS — Z87891 Personal history of nicotine dependence: Secondary | ICD-10-CM

## 2023-07-20 DIAGNOSIS — I272 Pulmonary hypertension, unspecified: Secondary | ICD-10-CM

## 2023-07-20 DIAGNOSIS — I252 Old myocardial infarction: Secondary | ICD-10-CM

## 2023-07-20 DIAGNOSIS — I5032 Chronic diastolic (congestive) heart failure: Secondary | ICD-10-CM

## 2023-07-20 DIAGNOSIS — C9000 Multiple myeloma not having achieved remission: Secondary | ICD-10-CM

## 2023-07-20 DIAGNOSIS — Z8249 Family history of ischemic heart disease and other diseases of the circulatory system: Secondary | ICD-10-CM

## 2023-07-20 DIAGNOSIS — D62 Acute posthemorrhagic anemia: Secondary | ICD-10-CM

## 2023-07-20 DIAGNOSIS — M1990 Unspecified osteoarthritis, unspecified site: Secondary | ICD-10-CM

## 2023-07-20 DIAGNOSIS — E7800 Pure hypercholesterolemia, unspecified: Secondary | ICD-10-CM

## 2023-07-20 DIAGNOSIS — Z85828 Personal history of other malignant neoplasm of skin: Secondary | ICD-10-CM

## 2023-07-20 DIAGNOSIS — E871 Hypo-osmolality and hyponatremia: Secondary | ICD-10-CM

## 2023-07-20 DIAGNOSIS — Q5564 Hidden penis: Secondary | ICD-10-CM

## 2023-07-20 DIAGNOSIS — I4510 Unspecified right bundle-branch block: Secondary | ICD-10-CM

## 2023-07-20 DIAGNOSIS — Z906 Acquired absence of other parts of urinary tract: Secondary | ICD-10-CM

## 2023-07-20 DIAGNOSIS — Z7901 Long term (current) use of anticoagulants: Secondary | ICD-10-CM

## 2023-07-20 DIAGNOSIS — I2582 Chronic total occlusion of coronary artery: Secondary | ICD-10-CM

## 2023-07-20 DIAGNOSIS — J9611 Chronic respiratory failure with hypoxia: Secondary | ICD-10-CM

## 2023-07-20 DIAGNOSIS — N179 Acute kidney failure, unspecified: Secondary | ICD-10-CM

## 2023-07-20 DIAGNOSIS — I2583 Coronary atherosclerosis due to lipid rich plaque: Secondary | ICD-10-CM

## 2023-07-20 DIAGNOSIS — E1165 Type 2 diabetes mellitus with hyperglycemia: Secondary | ICD-10-CM

## 2023-07-20 DIAGNOSIS — Z955 Presence of coronary angioplasty implant and graft: Secondary | ICD-10-CM

## 2023-07-20 DIAGNOSIS — M47817 Spondylosis without myelopathy or radiculopathy, lumbosacral region: Secondary | ICD-10-CM

## 2023-07-20 DIAGNOSIS — Z833 Family history of diabetes mellitus: Secondary | ICD-10-CM

## 2023-07-20 DIAGNOSIS — N182 Chronic kidney disease, stage 2 (mild): Secondary | ICD-10-CM

## 2023-07-20 DIAGNOSIS — E1151 Type 2 diabetes mellitus with diabetic peripheral angiopathy without gangrene: Secondary | ICD-10-CM

## 2023-07-20 DIAGNOSIS — I2510 Atherosclerotic heart disease of native coronary artery without angina pectoris: Secondary | ICD-10-CM

## 2023-07-20 DIAGNOSIS — Z7984 Long term (current) use of oral hypoglycemic drugs: Secondary | ICD-10-CM

## 2023-07-20 DIAGNOSIS — J439 Emphysema, unspecified: Secondary | ICD-10-CM

## 2023-07-20 DIAGNOSIS — Z79899 Other long term (current) drug therapy: Secondary | ICD-10-CM

## 2023-07-20 LAB — POC GLUCOSE
~~LOC~~ BKR POC GLUCOSE: 274 mg/dL — ABNORMAL HIGH (ref 70–100)
~~LOC~~ BKR POC GLUCOSE: 275 mg/dL — ABNORMAL HIGH (ref 70–100)

## 2023-07-20 MED ORDER — HYOSCYAMINE SULFATE 0.125 MG PO TBDI
.125 mg | ORAL_TABLET | SUBLINGUAL | 1 refills | Status: AC | PRN
Start: 2023-07-20 — End: ?

## 2023-07-20 MED ORDER — POLYETHYLENE GLYCOL 3350 17 GRAM/DOSE PO POWD
17 g | Freq: Every day | ORAL | 0 refills | Status: CN
Start: 2023-07-20 — End: ?

## 2023-07-20 MED ORDER — HYOSCYAMINE SULFATE 0.125 MG PO TBDI
.125 mg | ORAL_TABLET | SUBLINGUAL | 1 refills | Status: DC | PRN
Start: 2023-07-20 — End: 2023-07-20
  Filled 2023-07-20: 10d supply

## 2023-07-20 MED ORDER — BACITRACIN ZINC 500 UNIT/GRAM TP OINT
TOPICAL | 0 refills | Status: CN | PRN
Start: 2023-07-20 — End: ?

## 2023-07-20 MED ORDER — SENNOSIDES 8.6 MG PO TAB
1 | ORAL_TABLET | Freq: Two times a day (BID) | ORAL | 0 refills | Status: CN
Start: 2023-07-20 — End: ?

## 2023-07-20 MED ORDER — DICLOFENAC SODIUM 1 % TP GEL
4 g | Freq: Four times a day (QID) | TOPICAL | 0 refills | Status: CN | PRN
Start: 2023-07-20 — End: ?

## 2023-07-20 MED ORDER — CEPHALEXIN 500 MG PO CAP
500 mg | ORAL_CAPSULE | Freq: Four times a day (QID) | ORAL | 0 refills | Status: AC
Start: 2023-07-20 — End: ?
  Filled 2023-07-20: qty 12, 3d supply, fill #1

## 2023-07-20 NOTE — Progress Notes
Night 1900-0700    Pain: Pain is controlled with current analgesics. Medications being used: Acetaminophen and Anaspaz    Nutrition: Diet- Diet: Diabetic    Activity: Ambulation Episodes: pt up to bathroom PRN through out night with X1 assistance & a walker.    Incentive Spirometer this shift: No  Max Volume: N/A    Acute Events, Nursing Intervention or Provider Communication: PRN pain medications administered per pt request throughout night. No acute events overnight.    Patient Interventions and Education  Fall Risk/JHFRAT  Fall Risk Score: Total Fall Risk Score: 6.    The following interventions were implemented: Remembering to call for assistance with ambulation, Assistance managing patient care equipment, Bed/chair alarm, Walking aides (gait belt, walker, cane, etc.), X1 person assist, Fall identifiers in place, and Standard mobility safety, patient Verbalizes Understanding and Demonstrated Understanding of fall risk factors and interventions. Patient compliance: Patient compliant with interventions.    Restraints: No  Restraints Goal: choice: N/A  See Docflowsheet for restraint documentation, interventions, education, etc.    Intake and Output:      Date 07/19/23 0701 - 07/20/23 0700 07/20/23 0701 - 07/21/23 0700   Shift 0701-1900 1901-0700 24 Hour Total 0701-1900 1901-0700 24 Hour Total   INTAKE   P.O.  850 850      Other 100 0 100      Shift Total(mL/kg) 100(0.9) 850(7.4) 950(8.2)      OUTPUT   Urine(mL/kg/hr) 4100(3) 2150 6250        Urine Output (mL) (Indwelling Urinary Catheter 24 FR 3-way) 4100 2150 6250      Other 200 0 200        Stool Occurrence  2 x 2 x        Urine Drain Irrigant/Flush Out (mL) (Indwelling Urinary Catheter 24 FR 3-way) 200 0 200      Shift Total(mL/kg) 4300(37.3) 3329(51.8) 6450(56)      NET -4200 -1300 -5500      Weight (kg) 115.2 115.2 115.2 115.2 115.2 115.2         This RN provided education to patient. The following education topics were reviewed with patient: Medication Tylenol, Ducolax, calcium carbonate, doxycycline, insulin, Magox, melatonin, Sinulair, Ranexa, senna, Anaspaz  Patient Verbalizes Understanding     Discharge Plan: Discharge Planning: Patient has a ride

## 2023-07-20 NOTE — Discharge Planning (AHS/AVS)
Agency: [facility]  Phone: [phone]  Services: Skilled Nursing, Physical Therapy, Occupational Therapy  You have been set-up with a Home Health agency to support your post-hospital recovery at home.  We have found that continued focus on your post-hospital medical/therapy needs will help to decrease the time needed for you to complete your recovery.  Home health nurse will call you within 24-72 hours to arrange a time for the first visit.  Please be aware nurse is coming from other patient's home and will provide a time frame for visit and not a specific time.

## 2023-07-20 NOTE — Progress Notes
Urology Progress Note 07/20/2023     Assessment:  Ian Velez is a 83 y.o. 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 Oren Bracket     - Cardiology consulted 1/15, appreciate anticoagulation recommendations        >Discuss IV iron replacement with PCP given iron studies 1/16   - PTA medications as appropriate   - Pain: PO pain control, d/c IV pain medication for breakthrough  - Diet/FEN: Diabetic diet, SLIVF  - Endo: HDCF, NPH 50 Units SubQ BID before meals   - GI: Bowel regimen. Anti-emetics prn  - GU: Will maintain foley catheter x 2 weeks on discharge   > CBI clamped on rounds 1/15  - Renal: Cr 1.15 (1.28)   - Heme/ID: Afebrile. Hgb stable 8.6, WBC 12.10 (14.20)       > Abx: Vibracin   - Encourage OOB/Ambulation in hallways  - PPx: SCD's, chemoprophylaxis with lovenox held (Receiving ASA81mg  & Xarelto)     Disposition: Continue inpatient care, discharge planning today (1/17)     Andi Hence, MD  Urology Resident  Please page Urology on-call with questions  ________________________________________________________________________   SUBJECTIVE:     NAEO. Pain is controlled on the current regimen. Tolerated diet without nausea or emesis. Patient has ambulated in halls. Having bowel movements.    OBJECTIVE:                   Vital Signs: Most Recent                Vital Signs: Past 24 Hours   BP: 106/58 (01/17 0400)  Temp: 36.3 ?C (97.3 ?F) (01/17 0400)  Pulse: 98 (01/17 0400)  Respirations: 20 PER MINUTE (01/17 0400)  SpO2: 92 % (01/17 0400)  O2 Device: None (Room air) (01/17 0400)  O2 Liter Flow: 3 Lpm (01/16 0858)  BP: (84-110)/(41-58)   Temp:  [36.3 ?C (97.3 ?F)-36.4 ?C (97.6 ?F)]   Pulse:  [86-108]   Respirations:  [18 PER MINUTE-20 PER MINUTE]   SpO2:  [92 %-97 %]   O2 Device: None (Room air)  O2 Liter Flow: 3 Lpm       General: No acute distress  Pulm: Non-labored on RA   CV: Normal rate   Abd: Soft, non-tender, non-distended  GU: 22Fr 3-way catheter via urethral meatus in place draining light pink with  small clots in tubing, otherwise clear urine [no CBI] Perineal urethrostomy site sewn    Date 07/19/23 0701 - 07/20/23 0700 07/20/23 0701 - 07/21/23 0700   Shift 0701-1900 1901-0700 24 Hour Total 0701-1900 1901-0700 24 Hour Total   INTAKE   P.O.  850 850      Other 100 0 100      Shift Total(mL/kg) 100(0.9) 850(7.4) 950(8.2)      OUTPUT   Urine(mL/kg/hr) 4100(3) 2150 6250        Urine Output (mL) (Indwelling Urinary Catheter 24 FR 3-way) 4100 2150 6250      Other 200 0 200        Stool Occurrence  2 x 2 x        Urine Drain Irrigant/Flush Out (mL) (Indwelling Urinary Catheter 24 FR 3-way) 200 0 200      Shift Total(mL/kg) 4300(37.3) 4540(98.1) 6450(56)      NET -4200 -1300 -5500      Weight (kg) 115.2 115.2 115.2 115.2 115.2 115.2       Labs:                     Hematology                               Chemistry   Recent Labs     07/20/23  0321   WBC 12.10*   HGB 8.6*   PLTCT 203    Recent Labs     07/20/23  0321   NA 137   K 4.7   CL 96*   CO2 33*   BUN 24   CR 1.15   GFR >60   GLU 176*   CA 8.7        Malnutrition Details:                                                     Active Wounds                               ACTIVE PROBLEMS:  Principal Problem:    Neoplasm of bladder  Active Problems:    Pulmonary hypertension (HCC)    Obstructive sleep apnea    OA (osteoarthritis)    Lumbosacral spondylolysis    Hypoxemia    Essential hypertension    Mixed hyperlipidemia    History of bladder carcinoma    DM (diabetes mellitus), type 2 (HCC)    Stage 3 severe COPD by GOLD classification (HCC)    Chronic atrial fibrillation (HCC)    Coronary artery disease of native artery of native heart with stable angina pectoris (HCC)    On home oxygen therapy    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

## 2023-07-20 NOTE — Progress Notes
Hillis Range discharged on 07/20/2023.   Marland Kitchen  Discharge instructions reviewed with patient.  Valuables returned:   ADL Belongings at Bedside: CPAP/BiPAP machine, Eyeglasses/contacts.  Home medications:    .  Functional assessment at discharge complete: Yes .    PIV access removed. AVS completed and signed. Pt and wife have no further questions at this time.

## 2023-07-20 NOTE — Discharge Instructions - Pharmacy
Discharge Summary      Name: Ian Velez  Medical Record Number: [id number]        Account Number:  [id number]  Date Of Birth:  [birth year]                         Age:  83 y.o.  Admit date:  07/16/2023                     Discharge date: 07/20/2023      Discharge Attending:  Glennie Isle, MD  Discharge Summary Completed By: Darrell Jewel, APRN-NP    Service: Surgery-Urology    Reason for hospitalization:  Cancer of overlapping sites of bladder [facility]) [C67.8]  Neoplasm of bladder [D49.4]    Primary Discharge Diagnosis:   Neoplasm of bladder    Hospital Diagnoses:  Hospital Problems        Active Problems    * (Principal) Neoplasm of bladder    Pulmonary hypertension (HCC)    Obstructive sleep apnea    OA (osteoarthritis)    Lumbosacral spondylolysis    Hypoxemia    Essential hypertension    Mixed hyperlipidemia    History of bladder carcinoma    DM (diabetes mellitus), type 2 (HCC)    Stage 3 severe COPD by GOLD classification (HCC)    Chronic atrial fibrillation (HCC)    Coronary artery disease of native artery of native heart with stable   angina pectoris (HCC)    On home oxygen therapy    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 [facility])   Acute blood loss anemia  Hyponatremia  Acute kidney injury  Hyperglycemia     Present on Admission:   Neoplasm of bladder   Pulmonary hypertension (HCC)   Obstructive sleep apnea   OA (osteoarthritis)   Lumbosacral spondylolysis   Hypoxemia   Essential hypertension   Mixed hyperlipidemia   History of bladder carcinoma   DM (diabetes mellitus), type 2 (HCC)   Stage 3 severe COPD by GOLD classification (HCC)   Chronic atrial fibrillation (HCC)   Coronary artery disease of native artery of native heart with stable angina pectoris (HCC)   On home oxygen therapy        Significant Past Medical History        Atherosclerosis of native arteries of left leg with ulceration of   other part of foot [facility])      Comment:  Per office visit note on 12/01/2021 with Arlys John,                APRN.   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  DM (diabetes mellitus), type 2 (HCC)  Elevated PSA  Facial basal cell cancer  Former cigarette smoker  High cholesterol  History of bladder carcinoma  History of blood transfusion  HX: anticoagulation  Hyperlipidemia  Hypertension  Hypoxemia  Infection      Comment:  Cellulitis left leg  Low back pain  Lumbosacral spondylolysis  Lung disease      Comment:  COPD  Multiple myeloma and immunoproliferative neoplasms [facility])      Comment:  Not sure of dates [facility],                 [address] [place], [place] [address] Dr Cristopher Estimable  Myocardial infarction [facility])  OA (osteoarthritis)  Obesity  Obesity, Class III, BMI 40-49.9 (morbid obesity) (HCC)  Obstructive sleep apnea  On home oxygen therapy      Comment:  3L  Peripheral artery disease (HCC)  Pulmonary emphysema (HCC)  Pulmonary hypertension (HCC)  Skin cancer  Sleep apnea  Spinal stenosis of lumbar region  Stage 3 severe COPD by GOLD classification (HCC)  Unstable angina (HCC)  Vision decreased      Comment:  corrected with eye glasses    Allergies   Iodinated contrast media and Telepaque [iopanoic acid]    Brief Hospital Course   The patient was admitted and the following issues were addressed during this hospitalization: (with pertinent details including admission exam/imaging/labs).   83 y.o. 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 He tolerated the procedure well and was placed on continuous bladder irrigation. He was weaned off on POD 2. Cardiology was consulted after patient experienced angina and concerns for hematuria in the setting of chronic anticoagulation. He was started on Ranexa. Xarelto was restart and his urine remained clear. Pain ws controlled, tolerated a diet, had catheter education and all discharge criteria were met.     Items Needing Follow Up   Pending items or areas that need to be addressed at follow up: Pathology results, follow up in 2 weeks for TOV, Dr. Oren Bracket to determine future follow up based off pathology results.     Pending Labs and Follow Up Radiology    Pending labs and/or radiology review at this time of discharge are listed below: Please note- any labs with collected status will not have a result; if this area is blank, there are no items for review.   Pending Labs       Order Current Status    POC GLUCOSE In process    POC GLUCOSE In process    POC GLUCOSE In process    POC GLUCOSE In process    POC GLUCOSE In process    POC GLUCOSE In process    POC GLUCOSE In process    POC GLUCOSE In process    POC GLUCOSE In process    POC GLUCOSE In process    POC GLUCOSE In process    POC GLUCOSE In process    POC GLUCOSE In process    POC GLUCOSE In process    POC GLUCOSE In process    POC GLUCOSE In process    POC GLUCOSE In process    POC GLUCOSE In process    POC GLUCOSE In process    POC GLUCOSE In process    POC GLUCOSE In process    POC GLUCOSE In process    POC GLUCOSE In process    POC GLUCOSE In process    POC GLUCOSE In process    POC GLUCOSE In process  POC GLUCOSE In process    POC GLUCOSE In process    POC GLUCOSE In process    POC GLUCOSE In process    POC GLUCOSE In process    POC GLUCOSE In process SURGICAL PATHOLOGY In process              Medications      Medication List      START taking these medications     bacitracin zinc 500 unit/g topical ointment; Apply  topically to   affected area as Needed (as needed for comfort). Apply a small amount to   head of penis/catheter 2 times daily to reduce irritation.  Indications:   catheter lubrication; For: catheter lubrication; Quantity: 30 g; Refills:   0   cephalexin 500 mg capsule; Commonly known as: KEFLEX; Dose: 500 mg; Take   one capsule by mouth four times daily for 3 days. Start the day prior to   catheter removal.  Indications: infection prophylaxis, surgical; For:   infection prophylaxis, surgical; Quantity: 12 capsule; Refills: 0   diclofenac sodium 1 % topical gel; Commonly known as: VOLTAREN; Dose: 4   g; Apply four g topically to affected area four times daily as needed.   Indications: pain; For: pain; Quantity: 300 g; Refills: 0   hyoscyamine 0.125 mg rapid dissolve tablet; Commonly known as: ANASPAZ;   Dose: 0.125 mg; Place one tablet under tongue every 4 hours as needed.   Indications: bladder spasms; For: bladder spasms; Quantity: 60 tablet;   Refills: 1   polyethylene glycol 3350 17 gram/dose powder; Commonly known as:   MIRALAX; Dose: 17 g; Take seventeen g by mouth daily. Indications:   constipation; For: constipation; Quantity: 527 g; Refills: 0   ranolazine ER 500 mg tablet; Commonly known as: RANEXA; Dose: 500 mg;   Take one tablet by mouth twice daily. Indications: prevention of anginal   chest pain associated with coronary artery disease; For: prevention of   anginal chest pain associated with coronary artery disease; Quantity: 180   tablet; Refills: 1   Senna 8.6 mg tablet; Generic drug: senna; Dose: 1 tablet; Take one   tablet by mouth twice daily. Indications: constipation; For: constipation;   Quantity: 180 tablet; Refills: 0     CONTINUE taking these medications     acetaminophen SR 650 mg tablet; Commonly known as: TYLENOL 8 HOUR; Dose:   650 mg; Refills: 0   * albuterol sulfate 0.5% 2.5 mg/0.5 mL nebulizer solution; Dose: 2.5 mg;   Refills: 0   * albuterol sulfate 90 mcg/actuation HFA aerosol inhaler; Commonly known   as: PROAIR HFA; Dose: 2 puff; Refills: 0   aspirin EC 81 mg tablet; Commonly known as: ASPIR-LOW; Dose: 81 mg; Take   one tablet by mouth daily.; Quantity: 90 tablet; Refills: 1   atorvastatin 80 mg tablet; Commonly known as: LIPITOR; Dose: 80 mg; TAKE   ONE TABLET BY MOUTH DAILY; Quantity: 90 tablet; Refills: 3   calcium carbonate-vitamin D3 1250 mg/200 unit tablet; Commonly known as:   OS-CAL 500 + D; Dose: 1 tablet; Refills: 0   cyanocobalamin (vitamin B-12) 1,000 mcg tablet; Dose: 1,000 mcg;   Refills: 0   dapagliflozin propanediol 10 mg tablet; Commonly known as: FARXIGA;   Dose: 10 mg; Refills: 0   doxycycline hyclate 100 mg tablet; Commonly known as: VIBRACIN; Dose:   100 mg; Refills: 0   elderberry fruit 350 mg Cap; Dose: 1 capsule; Refills: 0   FREESTYLE LIBRE 3 SENSOR sensor  device; Generic drug: blood-glucose   sensor; Dose: 1 each; Refills: 0   HYDROcodone/acetaminophen 5/325 mg tablet; Commonly known as: NORCO;   Dose: 1 tablet; Refills: 0   hydrocortisone 2.5 % topical cream; Commonly known as: HYTONE; Refills:   0   insulin aspart 70/30 100 unit/mL (70-30) injection solution; Commonly   known as: NOVOLOG MIX 70/30; Dose: 80 Units; Refills: 0   isosorbide mononitrate ER 30 mg tablet,extended release 24 hr; Commonly   known as: IMDUR; Dose: 30 mg; Take one tablet by mouth every morning.;   Quantity: 90 tablet; Refills: 3   magnesium oxide 500 mg magnesium tablet; Commonly known as: PHILLIPS;   Dose: 500 mg; Refills: 0   metFORMIN 500 mg tablet; Commonly known as: GLUCOPHAGE; Dose: 500 mg;   Take one tablet by mouth twice daily with meals. Hold 48 hours after cath   procedure.  Resume with evening dose on 11/09/17; Quantity: 180 tablet;   Refills: 3   metoprolol succinate XL 50 mg extended release tablet; Commonly known   as: TOPROL XL; Dose: 50 mg; Take one tablet by mouth daily. Indications:   .; For: .; Quantity: 30 tablet; Refills: 0   montelukast 10 mg tablet; Commonly known as: SINGULAIR; Dose: 10 mg;   Refills: 0   multivitamin tablet; Commonly known as: ONE-A-DAY; Dose: 1 tablet;   Refills: 0   nitroglycerin 0.4 mg tablet; Commonly known as: NITROSTAT; Dose: 0.4 mg;   Place one tablet under tongue every 5 minutes as needed for Chest Pain.   Max of 3 tablets, call 911.; Quantity: 25 tablet; Refills: 3   potassium chloride SR 20 mEq tablet; Commonly known as: K-DUR; Dose: 60   mEq; Take three tablets by mouth twice daily. Take with a meal and a full   glass of water.  Indications: prevention of low potassium in the blood;   For: prevention of low potassium in the blood; Quantity: 180 tablet;   Refills: 0   rivaroxaban 20 mg tablet; Commonly known as: XARELTO; Dose: 20 mg;   Refills: 0   spironolactone 25 mg tablet; Commonly known as: ALDACTONE; Dose: 25 mg;   TAKE 1 TABLET BY MOUTH EVERY DAY, TAKE WITH FOOD; Quantity: 90 tablet;   Refills: 3   SYMBICORT 80-4.5 mcg/actuation aerosol inhaler; Generic drug:   budesonide-formoterol HFA; Refills: 0   tiotropium bromide 18 mcg inhalation capsules; Commonly known as:   SPIRIVA WITH HANDIHALER; Dose: 18 mcg; Refills: 0   tiZANidine 2 mg tablet; Commonly known as: ZANAFLEX; Dose: 2 mg; Take   one tablet by mouth every 8 hours as needed. Indications: muscle spasm;   For: muscle spasm; Quantity: 120 tablet; Refills: 0   torsemide 20 mg tablet; Commonly known as: DEMADEX; Take 60mg  twice   daily. Take by mouth.  Indications: accumulation of fluid resulting from   chronic heart failure; For: accumulation of fluid resulting from chronic   heart failure; Quantity: 270 tablet; Refills: 3  * This list has 2 medication(s) that are the same as other medications   prescribed for you. Read the directions carefully, and ask your doctor or   other care provider to review them with you. STOP taking these medications     enoxaparin 120 mg/ 0.8 mL syringe; Commonly known as: LOVENOX       Return Appointments and Scheduled Appointments     Scheduled appointments:      Sep 03, 2023 3:20 PM  Office visit with Altamease Oiler, MD  Cardiovascular Medicine: Mendel Ryder (CVM Exam) [phone] Jacob Moores.  [address] [place] [address]  [phone]          Contact information for after-discharge care                [facility]       [facility]    Phone: [phone]    Fax: [phone]    Where: [address] [place]ah [place] [address]    Service: Home Health Services                  Consults, Procedures, Diagnostics, Micro, Pathology   Consults: Cardiology  Surgical Procedures & Dates:  07/16/2023  Blue light cystoscopy, TURBT, size medium, Externalized perineal urethrotomy for access for TURBT.   Significant Diagnostic Studies, Micro and Procedures: noted in brief hospital course  Significant Pathology:  in process                                   Discharge Disposition, Condition   Patient Disposition: Home Health Care Svc [06]  Condition at Discharge: Stable    Code Status   Prior    Patient Instructions     Activity       Driving Restrictions   As directed      No driving while taking pain medication.    Strenuous Activity Restrictions   As directed      Please refrain from strenuous activity for 4 week(s). No lifting more than 10 pounds for 4 weeks.          Diet       Diabetic Diet   As directed      You should eat between 1600 and 2000 calories per day.  This is equal to 60g (grams) of carbohydrates per meal, and 30g of carbohydrates for a bedtime snack.    If you have questions about your diet after you go home, you can call a dietitian at [phone].          Wounds/Lines/Drains       Urinary/Suprapubic Catheter Care   As directed      General Urinary Catheter Care: Home Care Instructions    *Keep the catheter secured to your leg at all times.   *You can shower with the catheter.  Do not take baths.   *You may notice some blood- or pink-tinged urine. If this happens, relax, sit down, and drink water.  If you are very active, this can happen and it is normal.   *If you are concerned about your catheter, it stops draining, or if you are passing dark bloody urine (red wine appearance), please call your doctor.    Supplies to help you with your home care:  *Bacitracin, used as directed by doctor  *Leg bag x 1  *Overnight bag x 1  *Statlock to secure your catheter x 2             Discharge education provided to patient., Signs and Symptoms:   Report these signs and symptoms       Report These Signs and Symptoms   As directed      Please notify physician if experiencing any chest pain, shortness of breath, calf tenderness or unilateral leg swelling, uncontrolled pain, incision redness or foul smelling drainage from wound, fevers >100.4, or any other worsening signs/symptoms.        , Education: ,  and Others Instructions:   Other Orders       Questions About Your Stay   Complete by: As directed      Discharging attending physician: Marylen Ponto    Order comments: For questions or concerns regarding your hospital stay. Call [phone]  You may have questions or concerns after you are discharged.     From 8 AM to 4 PM Monday through Friday, please call:    If you are a patient of Dr. Erick Colace, Dr. France Ravens, or Dr. Glennie Isle, please call [phone].    If you are a patient of Dr. Ross Marcus or Dr. Nicholes Mango May, please call [phone].    If you are a patient of Dr. Mosetta Pigeon, please call [phone]    All other patients, please call [phone].     If calling after business hours, a weekend or holiday or with urgent questions, please call [phone] and ask for the urology resident on call.  In case of an emergency, please report to your nearest emergency department and contact us on the way.            Additional Orders: Case Management, Supplies, Home Health Home Health/DME                HOME HEALTH/DME  ONCE        Comments: Home Health/Durable Medical Equipment Order Details    Patient Name:  ERSKINE STEINFELDT                   Medical Record Number:   [id number]    Patient Diagnosis & ICD 10 Codes:     Cancer of overlapping sites of bladder [facility]) [C67.8]   Neoplasm of bladder [D49.4]   Type 2 diabetes mellitus with other circulatory complication, with long-term current use of insulin (HCC) [E11.59, Z79.4]   On home oxygen therapy [Z99.81]   Pulmonary hypertension (HCC) [I27.20]   Mixed hyperlipidemia [E78.2]   Essential hypertension [I10]   Chronic atrial fibrillation (HCC) [I48.20]   Stage 3 severe COPD by GOLD classification (HCC) [J44.9]   Osteoarthritis, unspecified osteoarthritis type, unspecified site [M19.90]   History of bladder carcinoma [Z85.51]   Class 3 severe obesity due to excess calories with serious comorbidity and body mass index (BMI) of 45.0 to 49.9 in adult [facility]) [Z56.387, Z68.42, E66.01]   Obstructive sleep apnea [G47.33]   Hypoxemia [R09.02]   Coronary artery disease of native artery of native heart with stable angina pectoris (HCC) [I25.118]   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 Glennie Isle     MD Phone Number: [phone]  MD Fax Number: [phone]  MD NPI: [id number]    Terrilyn Saver, APRN; NPI: [id number]      Agency Instructions:     Start of care within 24-48 hours of hospital discharge    RN to complete general assessment, including vitals with temperature, monitor and teach patient and/or caregiver medication management and compliance, monitor and teach pain management and pain medication compliance when necessary. Monitor and teach signs and symptoms of infection, monitor and teach disease management, including signs and symptoms, diet, who and when to call, hospital readmission avoidance.     Indwelling Urinary Catheter: 68fr 3-way and placed 1.13.25  Assess site for s/s of infection,  skin breakdown, patency, assess patient's understanding of catheter/drainage bag care, PRN flushing, provide/reinforce education as needed (patient provided education prior to discharge)      Please see attached AVS for additional discharge instruction.,     Physical Therapy  to evaluate and treat along with home safety evaluation.  Teach strategies for energy conservation, mobility training, strengthening, balance activities and address deficits, maximize function and improve safety.   and Occupational Therapy   to evaluate and treat along with home safety evaluation.  Teach strategies for energy conservation, mobility training, strengthening, balance activities and address deficits, maximize function and improve safety.  , and I certify that this patient is under my care and that I, or a nurse practitioner or physician's assistant working with me, had a face-to-face encounter that meets the physician's face-to-face encounter requirements with this patient on 1.17.25.     This patient is under my care, and I have initiated the establishment of the plan of care. This patient will be followed by a physician after discharge, who will periodically review the plan of care.   Clinical findings to support homebound status: Unsteady gait, Ambulates short distances only, and Poor balance  Clinical findings to support home care services: Deficits in medical management, Balance deficits with risk for falling, and Deficit in ADL's   Question Answer Comment   Attending Name/Contact Dr. Glennie Isle  Phone: [phone]  Fax: [phone] NPI: [id number]; Terrilyn Saver, APRN; NPI: [id number]    PCP Name/Contact Dr. Emerson Monte; Phone: [phone]; Fax: [phone]                             Signed:  Darrell Jewel, APRN-NP  07/20/2023      cc:  Primary Care Physician:  Emerson Monte Verified    Referring physicians:  Self, Referral   Additional provider(s):        Did we miss something? If additional records are needed, please fax a request on office letterhead to [phone]. Please include the patient's name, date of birth, fax number and type of information needed. Additional request can be made by email at [email or web address]. For general questions of information about electronic records sharing, call [phone].

## 2023-07-24 ENCOUNTER — Encounter: Admit: 2023-07-24 | Discharge: 2023-07-24 | Payer: MEDICARE

## 2023-07-24 NOTE — Telephone Encounter
Spoke with patients wife [name] about whether or not the patient was able to schedule his cath removal locally. She stated that his Home Health nurse has offered to remove it but they need an order. Message routed to Dr. Oren Bracket and Simonne Come for an order. Will fax order to [phone] once it has been placed in the patients chart.

## 2023-07-25 ENCOUNTER — Encounter: Admit: 2023-07-25 | Discharge: 2023-07-25 | Payer: MEDICARE

## 2023-07-26 ENCOUNTER — Encounter: Admit: 2023-07-26 | Discharge: 2023-07-26 | Payer: MEDICARE

## 2023-07-27 ENCOUNTER — Encounter: Admit: 2023-07-27 | Discharge: 2023-07-27 | Payer: MEDICARE

## 2023-07-28 NOTE — Telephone Encounter
I called patient and spoke with him and his wife regarding the pathology results.  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  He would therefore be classified as high-grade Ta urothelial cell carcinoma.  He has has a history of high-grade urothelial cell carcinoma and underwent induction BCG approximately 20 years ago without maintenance.  He then developed a recurrence and underwent repeat induction BCG without maintenance therapy.  I reviewed the AUA guidelines.  I discussed the treatment options as well as the risks, benefits, and alternatives of each treatment option.  The treatment options discussed included observation, intravesical therapy with BCG, intravesical therapy with sequential gemcitabine/docetaxel, early cystectomy.  He has not technically received adequate BCG since he did not receive maintenance therapy.  Also, he has had a response for over 20 years and therefore it is difficult for me to characterize him as BCG unresponsive.  I therefore think it would be reasonable to proceed with repeat induction BCG and proceed with maintenance.  He reports would be easier to get this done closer to home with his outside urologist due to transportation issues.  I discussed that I be happy to have him proceed with BCG here at [facility].  He is going to reach out to his outside urologist and if they are willing to proceed with BCG, I will forward his recent operative notes, pathology, office notes to their office.  He reports he will reach out to them in the near future and will let us know how he would like to proceed.    Final Diagnosis   A. 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               Muscularis propria present.     B. Urinary bladder, posterior bladder wall tumor, biopsy:               Benign urothelium with reactive changes

## 2023-07-31 ENCOUNTER — Encounter: Admit: 2023-07-31 | Discharge: 2023-07-31 | Payer: MEDICARE

## 2023-08-20 ENCOUNTER — Encounter: Admit: 2023-08-20 | Discharge: 2023-08-20 | Payer: MEDICARE

## 2023-08-21 ENCOUNTER — Encounter: Admit: 2023-08-21 | Discharge: 2023-08-21 | Payer: MEDICARE

## 2023-08-28 ENCOUNTER — Encounter: Admit: 2023-08-28 | Discharge: 2023-08-28 | Payer: MEDICARE

## 2023-08-30 ENCOUNTER — Encounter: Admit: 2023-08-30 | Discharge: 2023-08-30 | Payer: MEDICARE

## 2023-08-31 ENCOUNTER — Encounter: Admit: 2023-08-31 | Discharge: 2023-08-31 | Payer: MEDICARE

## 2023-09-03 ENCOUNTER — Encounter: Admit: 2023-09-03 | Discharge: 2023-09-03 | Payer: MEDICARE

## 2023-09-03 ENCOUNTER — Ambulatory Visit: Admit: 2023-09-03 | Discharge: 2023-09-04 | Payer: MEDICARE

## 2023-10-04 ENCOUNTER — Encounter: Admit: 2023-10-04 | Discharge: 2023-10-04

## 2023-10-04 NOTE — Telephone Encounter
 Dear Dr. Sandria Manly;  After our last conversation with on 09/03/23 on Ian Velez's breathing issues.  You asked me to check back in with you.      He completed his 6-bladder chemo treatments 09/18/23.  We go back in 3 months for a check.      The day we were in your office ... Ian Velez felt his difficulty breathing was because of surgery & chemo ... (But I knew better) I only see him struggle more often .... I feel so helpless when he says to me I can't breathe when he lays down for a nap /bedtime (we have a bed that we can elevate his head) or just walks from one room to the other.  I try to remind him to turn his oxygen tank up when he walks from house to car then he turns back down to 3 liters .     His weight fluctuates between 254 - 257.  He has a rescue inhaler that I've tried to see if I can make his breathing any better.    Dr. Sandria Manly I don't know what else to do.         Reviewed medications with wife    She has been recording bp and pulse daily today 120/63 with pulse of 91.  He has been running similar.

## 2023-10-08 ENCOUNTER — Encounter: Admit: 2023-10-08 | Discharge: 2023-10-08

## 2023-10-08 DIAGNOSIS — I2089 Chronic stable angina: Secondary | ICD-10-CM

## 2023-10-08 DIAGNOSIS — I25118 Atherosclerotic heart disease of native coronary artery with other forms of angina pectoris: Secondary | ICD-10-CM

## 2023-10-08 DIAGNOSIS — I482 Chronic atrial fibrillation, unspecified: Secondary | ICD-10-CM

## 2023-10-08 DIAGNOSIS — I519 Heart disease, unspecified: Secondary | ICD-10-CM

## 2023-10-08 MED ORDER — METOPROLOL SUCCINATE 50 MG PO TB24
50 mg | ORAL_TABLET | Freq: Two times a day (BID) | ORAL | 1 refills | 90.00000 days | Status: AC
Start: 2023-10-08 — End: ?

## 2023-10-08 MED ORDER — RIVAROXABAN 20 MG PO TAB
20 mg | ORAL_TABLET | Freq: Every day | ORAL | 1 refills | 30.00000 days | Status: AC
Start: 2023-10-08 — End: ?

## 2023-11-14 ENCOUNTER — Encounter: Admit: 2023-11-14 | Discharge: 2023-11-14 | Payer: MEDICARE

## 2023-11-19 ENCOUNTER — Encounter: Admit: 2023-11-19 | Discharge: 2023-11-19 | Payer: MEDICARE

## 2023-11-19 DIAGNOSIS — I25118 Atherosclerotic heart disease of native coronary artery with other forms of angina pectoris: Secondary | ICD-10-CM

## 2023-11-19 MED ORDER — ISOSORBIDE MONONITRATE 30 MG PO TB24
30 mg | ORAL_TABLET | Freq: Every morning | ORAL | 3 refills | 90.00000 days | Status: AC
Start: 2023-11-19 — End: ?

## 2023-12-05 ENCOUNTER — Encounter: Admit: 2023-12-05 | Discharge: 2023-12-05 | Payer: MEDICARE

## 2023-12-10 ENCOUNTER — Encounter: Admit: 2023-12-10 | Discharge: 2023-12-10 | Payer: MEDICARE

## 2023-12-11 ENCOUNTER — Encounter: Admit: 2023-12-11 | Discharge: 2023-12-11 | Payer: MEDICARE

## 2023-12-11 DIAGNOSIS — I25118 Atherosclerotic heart disease of native coronary artery with other forms of angina pectoris: Secondary | ICD-10-CM

## 2023-12-11 DIAGNOSIS — E782 Mixed hyperlipidemia: Secondary | ICD-10-CM

## 2023-12-11 MED ORDER — ATORVASTATIN 80 MG PO TAB
80 mg | ORAL_TABLET | Freq: Every day | ORAL | 3 refills | 90.00000 days | Status: AC
Start: 2023-12-11 — End: ?

## 2023-12-16 ENCOUNTER — Encounter: Admit: 2023-12-16 | Discharge: 2023-12-16 | Payer: MEDICARE

## 2023-12-17 ENCOUNTER — Encounter: Admit: 2023-12-17 | Discharge: 2023-12-17 | Payer: MEDICARE

## 2023-12-18 LAB — BASIC METABOLIC PANEL
ANION GAP: 11
BLD UREA NITROGEN: 14
CALCIUM: 9.3
CHLORIDE: 90 — ABNORMAL LOW (ref 98–109)
CO2: 32 — ABNORMAL HIGH (ref 20–31)
CREATININE: 1
GLUCOSE,PANEL: 193 — ABNORMAL HIGH (ref 74–106)
POTASSIUM: 3.5
SODIUM: 133 — ABNORMAL LOW (ref 136–145)

## 2023-12-19 ENCOUNTER — Encounter: Admit: 2023-12-19 | Discharge: 2023-12-19 | Payer: MEDICARE

## 2023-12-19 DIAGNOSIS — I25118 Atherosclerotic heart disease of native coronary artery with other forms of angina pectoris: Secondary | ICD-10-CM

## 2023-12-19 DIAGNOSIS — I10 Essential (primary) hypertension: Secondary | ICD-10-CM

## 2023-12-19 DIAGNOSIS — I272 Pulmonary hypertension, unspecified: Secondary | ICD-10-CM

## 2023-12-19 DIAGNOSIS — I519 Heart disease, unspecified: Secondary | ICD-10-CM

## 2023-12-19 DIAGNOSIS — G4733 Obstructive sleep apnea (adult) (pediatric): Secondary | ICD-10-CM

## 2023-12-19 DIAGNOSIS — R0989 Other specified symptoms and signs involving the circulatory and respiratory systems: Secondary | ICD-10-CM

## 2023-12-24 ENCOUNTER — Encounter: Admit: 2023-12-24 | Discharge: 2023-12-24 | Payer: MEDICARE

## 2023-12-25 LAB — BASIC METABOLIC PANEL
ANION GAP: 11
BLD UREA NITROGEN: 27 — ABNORMAL HIGH (ref 9–23)
CALCIUM: 10
CHLORIDE: 91 — ABNORMAL LOW (ref 98–109)
CO2: 29
CREATININE: 1.2
GLUCOSE,PANEL: 161 — ABNORMAL HIGH (ref 74–106)
POTASSIUM: 4
SODIUM: 131 — ABNORMAL LOW (ref 136–145)

## 2023-12-25 LAB — NT-PRO-BNP: NT-PRO-BNP: 347

## 2023-12-26 ENCOUNTER — Encounter: Admit: 2023-12-26 | Discharge: 2023-12-26 | Payer: MEDICARE

## 2023-12-27 ENCOUNTER — Encounter: Admit: 2023-12-27 | Discharge: 2023-12-27 | Payer: MEDICARE

## 2023-12-27 DIAGNOSIS — I519 Heart disease, unspecified: Secondary | ICD-10-CM

## 2023-12-27 DIAGNOSIS — I10 Essential (primary) hypertension: Secondary | ICD-10-CM

## 2023-12-27 DIAGNOSIS — G4733 Obstructive sleep apnea (adult) (pediatric): Secondary | ICD-10-CM

## 2023-12-27 DIAGNOSIS — R0989 Other specified symptoms and signs involving the circulatory and respiratory systems: Secondary | ICD-10-CM

## 2023-12-27 DIAGNOSIS — I272 Pulmonary hypertension, unspecified: Secondary | ICD-10-CM

## 2023-12-27 DIAGNOSIS — I25118 Atherosclerotic heart disease of native coronary artery with other forms of angina pectoris: Secondary | ICD-10-CM

## 2024-01-02 ENCOUNTER — Encounter: Admit: 2024-01-02 | Discharge: 2024-01-02 | Payer: MEDICARE

## 2024-01-07 ENCOUNTER — Encounter: Admit: 2024-01-07 | Discharge: 2024-01-07 | Payer: MEDICARE

## 2024-01-07 NOTE — Telephone Encounter
 Richardson, patient's wife called nursing line to report the patient is currently hospitalized at [facility]. He is scheduled to see Lonell Pinal, NP tomorrow and needs to reschedule. The scheduling line for the coral team given to [facility] to reschedule.  Richardson states Chase was admitted to [facility] on 01/01/24 for low hemoglobin and given 2 units of blood. He was discharged on 01/03/24 and readmitted on 01/04/24 for hypotension and possible septic shock.

## 2024-01-09 ENCOUNTER — Encounter: Admit: 2024-01-09 | Discharge: 2024-01-09 | Payer: MEDICARE

## 2024-01-15 ENCOUNTER — Encounter: Admit: 2024-01-15 | Discharge: 2024-01-15 | Payer: MEDICARE

## 2024-01-17 ENCOUNTER — Ambulatory Visit: Admit: 2024-01-17 | Discharge: 2024-01-18 | Payer: MEDICARE

## 2024-01-17 ENCOUNTER — Encounter: Admit: 2024-01-17 | Discharge: 2024-01-17 | Payer: MEDICARE

## 2024-01-17 ENCOUNTER — Ambulatory Visit: Admit: 2024-01-17 | Discharge: 2024-01-17 | Payer: MEDICARE

## 2024-01-17 DIAGNOSIS — I5032 Chronic diastolic (congestive) heart failure: Principal | ICD-10-CM

## 2024-01-17 NOTE — Progress Notes
 Date of Service: 01/17/2024    Primary   Cardiologist: Dr. Maurice      HPI       I had the pleasure of seeing Ian Velez in the [facility] today for post hospital follow up.  He is a new to me patient    Patient is a 83 y.o. male who is admitted from [facility] from 7/5 through 7/9 for septic shock at [facility].  He has medical history that is significant for anemia, stage IV COPD, pulmonary hypertension, heart failure with preserved ejection fraction, OSA on CPAP.     TODAY'S VISIT:      Ian Velez is an 83 year old male with COPD and anemia who presents with recent hospitalization for low blood pressure and pulse rate.    He was recently hospitalized at [facility] due to a significant drop in blood pressure and pulse rate. Two days prior to this event, he underwent a colonoscopy and an endoscopy. A day and a half after discharge, he experienced difficulty finding a pulse and had a low heart rate, prompting his companion to call an ambulance. Despite multiple evaluations, the exact cause of his symptoms remains unclear.    During his hospitalization, he received two blood transfusions due to blood in his bowel, which was initially investigated with a colonoscopy. The procedure revealed three healing ulcers, but it was uncertain if these were the source of his bleeding. He has a history of anemia, with a hemoglobin level of 8.6 in January, attributed to blood loss from previous cancer surgeries.    He has been diagnosed with stage 4 COPD and uses three liters of oxygen. He also uses a BiPAP machine and takes Trelegy for his condition. Managing his water  weight helps with his breathing. He was on torsemide  60 mg twice daily and metolazone  once a week for fluid management, but did not receive these medications during his recent hospital stay, resulting in significant fluid retention. His weight increased to 270 pounds during hospitalization, but he has since reduced it to 258 pounds. He maintains a daily weight chart.    He has a history of pulmonary hypertension. He was told by his doctor that his ejection fraction was 60% on a recent echocardiogram. He takes metoprolol  and Spiriva  50 mg daily, and his anticoagulation is managed with Xarelto  20 mg. He was previously on metformin , which was discontinued due to elevated creatinine levels.    No cardiologist consultation during recent hospitalization despite low blood pressure.    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     11/15/2021 MRI Left foot ([facility]): Ulcer along the medial most aspect of the great toe distally is noted. There is extensive soft tissue swelling of the great toe extending towards the midfoot and hindfoot with post gadolinium enhancement consistent with cellulitis.    11/14/2021 US  Duplex left lower extremity veins ([facility]): No evidence of dep venous thrombosis in the lower extremity veins as visualized.  10/31/2021 Bilateral lower ext arterial doppler US  ([facility]): 75% or greater stenosis involving the distal right SFA and/or proximal popliteal artery. Proximal trifurcation vessels on the right are not evaluated.       Diastolic dysfunction with chronic heart failure (CMS-HCC) 01/11/2018 Obesity, Class III, BMI 40-49.9 (morbid obesity) (CMS-HCC) 01/11/2018    Pulmonary hypertension (CMS-HCC)     Obstructive sleep apnea     OA (osteoarthritis)     Lumbosacral spondylolysis     Hypoxemia     Essential hypertension      Echo - 01/30/18 at [facility] - 1. Qualitatively normal LV size and function, EF ~ 55%; 2. Unable to accurately assess diastolic function due to underlying atrial fibrillation; 3. Qualitatively the RV appears mildly dilated with mildly reduced systolic function 4. Qualitatively the RA appears mildly dilated; 5. Valvular structures were not well visualized.  No significant Doppler abnormalities; 6. Estimated peak systolic PA pressure =  45 mmHg; 7. No pericardial effusion      Mixed hyperlipidemia     History of bladder carcinoma     DM (diabetes mellitus), type 2 (CMS-HCC)     Stage 3 severe COPD by GOLD classification (CMS-HCC)     Chronic atrial fibrillation (CMS-HCC)     Coronary artery disease of native artery of native heart with stable angina pectoris      11/07/2017 coronary angiogram  - Highly complex severe disease of the right coronary and circumflex with a complete occlusion of both the circumflex and right coronary artery with TIMI 1 flow distal in both arteries.Mild disease of the LAD and left main.Successful intervention of the right coronary, re-establishing normal flow.  There continues to be serial 90% stenoses in the distal right coronary artery, that are also heavily calcified.      Chronic GERD     On home oxygen therapy      3L      Chronic stable angina     Chronic anticoagulation        ROS: See HPI      Physical Exam  General Appearance: In NAD  Neck Veins: normal JVP, neck veins are not distended; no HJR   Chest Inspection: chest is normal in appearance   Respiratory Effort: breathing comfortably, no respiratory distress   Auscultation/Percussion: lungs clear to auscultation, no rales, rhonchi or wheezing, wearing O2 per nasal cannula  Cardiac Rhythm: regular rhythm and normal rate   Cardiac Auscultation: S1, S2 normal, no rub, no definite S3  or S4   Murmurs: no murmur   Peripheral Circulation: normal peripheral circulation   Pedal Pulses: normal symmetric pedal pulses   Lower Extremity Edema: no lower extremity edema   Abdominal Exam: soft, non-tender, no obvious masses, bowel sounds normal   Gait & Station: walks without assistance   Orientation: oriented to person, place and time   Affect & Mood: appropriate and sustained affect   Language and Memory: patient responsive and seems to comprehend information   Neurologic Exam: neurological assessment grossly intact   Vital signs were reviewed  Cardiovascular Studies: Last echo7/7/25 at [facility]   1.  Normal left ventricular systolic and diastolic function.   2.  Moderate tricuspid valve regurgitation.   3.  Moderate pulmonary hypertension RVSP-59 mmHg.         Show Result ComparisonLow normal left ventricular systolic function with an EF of 50%.  The LV cavity is normal in size.  There are no focal areas of hypokinesis.    Unable to assess diastolic function on the current study.  The RV appears mildly dilated mildly impaired function.  Mitral annular calcification with nonspecific mitral valve thickening.  There is no stenosis.  There is mild to moderate MR.  Moderate TR.  The aortic valve is sclerotic and calcified.  There is no significant stenosis.  Trace AI.  Elevated peak PAP of 55 mmHg.  The right atrial pressure is normal at 3 mmHg.  Normal aortic root size.  No pericardial effusion.       Prior study was obtained on 02/16/2023, showed an EF of 60%, the RV was dilated with impaired function, there was mitral annular calcification with mild MR, mild TR, the aortic valve was calcified with mild AI, the aorta was normal in size, no pericardial effusion, the range pressure was elevated at 15 mmHg, the peak pulmonary pressure was elevated at 82 mmHg.  RHC 10/98/24 PRESSURES:    Right atrial pressure of 15 mmHg.  Right ventricular pressure of 80/15 mmHg.  Pulmonary capillary wedge pressure of 18 mmHg.  Pulmonary artery pressure of 82/23 with a mean pulmonary artery pressure of 43 mmHg.  Transpulmonary gradient was 25 mmHg.  Diastolic pulmonary gradient was 5 mmHg.  Pulmonary vascular resistance was 6.1 Wood units.  Systemic vascular resistance was 1736 dynes per second to centimeter power of -5.  Cardiac output by thermodilution was 4.2 L/min.  Cardiac index by thermodilution was 1.8 L/min/sq m.  Cardiac output by Fick was 4.1 L/min.  Cardiac index by Fick was 1.8 L/min/sq m.  Cardiac power output was 0.95 W.  Cardiac power index 0.41 W/sq m.  PAPI was 3.9.     TOTAL FLUORO TIME:  1.4 minutes.     TOTAL AIR KERMA:  46 mGy.      My attending, Dr. Kamal Gupta, was present for the entirety of the procedure performing necessary key portions of procedure.     FINAL IMPRESSIONS:    Elevated right and left-sided pressures.  Significantly elevated pulmonary artery pressures.  Low cardiac index by thermodilution and Fick.      Problems Addressed Today  No diagnosis found.           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    The patient today describes NYHA Functional Class III, stagec symptoms.      >Diuretic Therapy   Current Dose:  Torsemide  60 mg twice daily with metolazone  5 mg once weekly Changes:   None     Patient appears euvolemic on physical exam, his weight today is 251 pounds, estimated dry weight has been between 245-250  I reviewed most recent labs and most recent cardiac testing with patient and family.  Shared medical decision making involves eliciting patient and/or family preferences, education and explaining risks and benefits of management options.  HF Therapy:   GDMT Current Dose Changes made at visit   BB Toprol -XL 25 mg p.o. twice daily    ACEI/ARB/ARNI     Aldosterone antagonist Spironolactone  50 mg daily  SGLT2i  None PTA    Hydralazine/Nitrate Imdur  30 mg daily Ivabradine Not applicable, not on maximized beta-blocker    Cardiac rehab EF greater than 35%    Advanced Therapies Candidacy/Contraindications   Age > 80 years             Point of care chemistry today shows stable potassium 4.3, creatinine 1.2 however glucose is 272.  I will send a note to Dr. Maurice to see if he would recommend adding a GLP-1 such as Ozempic.  In addition I think he might benefit from Cardiomems    COPD, OSA on BiPAP    CAD: the patient is not describing any anginal type symptoms on asa and statin.    Hypertension-controlled BP today 114/53    Renal monitoring  Lab Results   Component Value Date    CR 1.2 01/17/2024    CR 1.28 12/25/2023    CR 1.07 12/18/2023          Permanent atrial fibrillation, CHA2DS2-VASc score 6, on oral anticoagulation with Xarelto  20 mg daily.     Anemia: Last known CBC on 7/8 showed hemoglobin 7.9, hematocrit 26.9: Repeat CBC and iron  studies were ordered today         Diabetes Mellitus Type II- Managed by Primary team.    Lab Results   Component Value Date/Time    HGBA1C 7.6 (H) 04/11/2023 03:44 AM    HGBA1C 8.4 (H) 06/22/2020 12:00 AM    HGBA1C 7.5 (H) 12/22/2019 12:00 AM        Dyslipidemia- Treated with  Atorvastatin  40 mg po daily.  Denies myalgias. Lipid profile as noted below.   Lab Results   Component Value Date    CHOL 71 11/09/2023    TRIG 102 11/09/2023    HDL 35 (L) 11/09/2023    LDL 23 11/09/2023    VLDL 22 04/11/2023    NONHDLCHOL 36 (L) 11/09/2023    CHOLHDLC 2.0 11/09/2023   .     Obesity-Body mass index is 41.9 kg/m?SABRA. Weight loss, Cardiac Healthy diet, and exercise when patient can tolerate.        Follow up appointment: Scheduled with Dr. Maurice on 9/19    I have personally documented the HPI, exam and medical decision making.  Patient education: I reviewed recent lab results and current medications, medication instructions, discussed heart failure signs & symptoms,  low  sodium diet, fluid restriction and daily weights.I have instructed the patient on the plan of care and they verbalize understanding of the plan. Please see AVS for full patient teaching. Patient advised to call our office if s/he has any problems, questions, worsening symptoms, or concerns prior to the next appointment.   Thanks for allowing me to see this nice patient. If I can be of additional assistance, please don't hesitate to contact me.     Current Outpatient Medications on File Prior to Visit   Medication Sig Dispense Refill    acetaminophen  SR (TYLENOL  8 HOUR) 650 mg tablet Take one tablet by mouth as Needed for Pain.      albuterol  0.5% (PROVENTIL ; VENTOLIN ) 2.5 mg/0.5 mL nebulizer solution Inhale one each solution by nebulizer as directed as Needed for Shortness of Breath or Wheezing.      albuterol  sulfate (PROAIR  HFA) 90 mcg/actuation HFA aerosol inhaler Inhale two puffs by mouth into the lungs as Needed for Wheezing or Shortness of Breath.      ALPRAZolam  (XANAX ) 0.25 mg tablet Take one tablet by mouth  at bedtime as needed.      ascorbic acid (vitamin C) (VITAMIN C) 1,000 mg tablet Take one tablet by mouth daily.      aspirin  EC (ASPIR-LOW) 81 mg tablet Take one tablet by mouth daily. 90 tablet 1    atorvastatin  (LIPITOR) 80 mg tablet TAKE ONE TABLET BY MOUTH DAILY 90 tablet 3    bacitracin  zinc  500 unit/g topical ointment Apply  topically to affected area as Needed (as needed for comfort). Apply a small amount to head of penis/catheter 2 times daily to reduce irritation.  Indications: catheter lubrication 30 g 0    blood-glucose sensor (FREESTYLE LIBRE 3 SENSOR) sensor device Use one each as directed every 14 days.      calcium  carbonate-vitamin D3 (OS-CAL 500 + D) 1250 mg/200 unit tablet Take one tablet by mouth at bedtime daily. Calcium  Carb 1250mg  delivers 500mg  elemental Ca      cyanocobalamin  (vitamin B-12) 1,000 mcg tablet Take one tablet by mouth daily.      cyclobenzaprine  (FLEXERIL ) 5 mg tablet Take one tablet by mouth as Needed for Muscle Cramps.      diclofenac  sodium (VOLTAREN ) 1 % topical gel Apply four g topically to affected area four times daily as needed. Indications: pain 300 g 0    elderberry fruit 350 mg cap Take one capsule by mouth daily.      HYDROcodone /acetaminophen  (NORCO) 5/325 mg tablet Take one tablet by mouth as Needed for Pain.      hydrocortisone (HYTONE) 2.5 % topical cream Apply  topically to affected area as Needed.      insulin  aspart 70/30 (+) (NOVOLOG  MIX 70/30) 100 unit/mL (70-30) Inject eighty Units under the skin twice daily. Sometimes will drop evening dose to 60-70u if BG is low      isosorbide  mononitrate ER (IMDUR ) 30 mg tablet,extended release 24 hr Take one tablet by mouth every morning. 90 tablet 3    magnesium  oxide (PHILLIPS) 500 mg magnesium  tablet Take one tablet by mouth at bedtime daily.      metFORMIN  (GLUCOPHAGE ) 500 mg tablet Take one tablet by mouth twice daily with meals. Hold 48 hours after cath procedure.  Resume with evening dose on 11/09/17 180 tablet 3    metOLazone  (ZAROXOLYN ) 5 mg tablet Take one tablet by mouth every 7 days. 30 tablet 1    metoprolol  succinate XL (TOPROL  XL) 25 mg extended release tablet Take one tablet by mouth twice daily. Indications: . 180 tablet 3    montelukast  (SINGULAIR ) 10 mg tablet Take one tablet by mouth at bedtime daily.      multivitamin (ONE-A-DAY) tablet Take one tablet by mouth daily.      nitroglycerin  (NITROSTAT ) 0.4 mg tablet Place one tablet under tongue every 5 minutes as needed for Chest Pain. Max of 3 tablets, call 911. 25 tablet 3    pantoprazole  DR (PROTONIX ) 40 mg tablet Take one tablet by mouth twice daily.      polyethylene glycol 3350  (MIRALAX ) 17 gram/dose powder Take seventeen g by mouth daily. Indications: constipation 527 g 0    potassium chloride  SR (K-DUR) 20 mEq tablet Take two tablets by mouth twice daily. Take with a meal and a full glass of water .  Indications: prevention of low potassium in the blood 180 tablet 3    rivaroxaban  (XARELTO ) 20 mg tablet Take one tablet by mouth daily. Take with food. 90 tablet 1    spironolactone  (ALDACTONE ) 50 mg tablet Take one tablet by mouth  daily. take with food 90 tablet 3    SYMBICORT  80-4.5 mcg/actuation aerosol inhaler Inhale 2 PUFFS IN THE morning AND AT bedtime. RINSE MOUTH AND THROAT AFTER USE TO REDUCE AFTERTASTE AND INCIDENCE OF CANDIDIASIS. DO NOT SWALLOW MOUTH WITH WATER  AFTER USE.      tiotropium bromide  (SPIRIVA ) 18 mcg capsule for inhaler Place one capsule into inhaler and inhale into lungs as directed daily.      tiZANidine  (ZANAFLEX ) 2 mg tablet Take one tablet by mouth every 8 hours as needed. Indications: muscle spasm (Patient taking differently: Take one tablet by mouth as Needed. Indications: muscle spasm) 120 tablet 0    torsemide  (DEMADEX ) 20 mg tablet Take 60mg  twice daily. Take by mouth.  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 270 tablet 3     No current facility-administered medications on file prior to visit.       Lonell Pinal AGPCNP-BC, CHFN,HF-Cert  Advanced Heart Failure APP  [facility]  Collaborating physicians: Dr. Simmie Fairly and Dr. Velva Pate    Total visit of 45 minutes of which 30 minutes were dedicated to advanced HF counseling/education and care coordination.   This includes face-to-face in person visit with patient as well as nonface-to-face time including review of the EMR, outside records, labs, radiologic studies, echocardiogram & other cardiovascular studies, formulation of treatment plan, after visit summary, future disposition,  and lastly on documentation    Plan:  The following is a copy of the written instructions and plan I gave to the patient during his office visit.    Patient Instructions   Thank you for coming to [facility], it is a pleasure to be a part of your Heart Care Team.    Your therapy plan of care was reviewed with you by Vernell.    At today's visit we discussed your heart failure diagnosis and treatment plan options.  Here is the summary of the treatment plan we discussed:    Medications:    Labs    Testing/ Therapy:    Follow up in the Heart Failure Clinic/ Cardiology:  9/19 with Dr. Maurice in [facility]    If you are being seen in the Heart Failure clinic:   Follow a 2000 mg sodium diet and 2-liter fluid restriction.    Call for any worsening symptoms of shortness of breath, swelling, sudden weight gain, lightheadedness, heart racing, or chest pain.  Weigh yourself daily and call us  if your weight increase is greater than 3 pounds overnight OR 5 pounds in one week.   It is helpful for you to bring your list of current medications with you to all clinic visit.     Contacting our office:     -Business Hours: Monday-Friday, 8:00 am-4:30 pm (excluding Holidays).      -For medical questions or concerns, please send us  a message through your MyChart account or call the Advance Heart Failure team nursing triage line at [phone]. Please leave a detailed message with your name, date of birth, and reason for your call.  If your message is received before 3:30pm, every effort will be made to call you back the same day.  Please allow time for us  to review your chart prior to call back.     -Please allow a minimum of 10-14 business days if you are requesting clearance from your cardiologist for procedures, FMLA paperwork, disability, or DOT forms. An office visit or testing may be  required for us  to provide clearance.     -For medication refills please contact your pharmacy. You can also send us  a prescription question through your MyChart or call the nurse triage line above.      -To schedule any follow up appointments or to change an existing appointment please contact our scheduling department at [phone].     Below are [facility] [place] outpatient lab locations that do not require an appointment. You can walk in during the times below:     [facility]: [address], [address] [place]  [place], [place] [address] Phone: [phone] M-F 7 a.m. -6 p.m. Saturday 7 a.m. - noon   [facility]: [address], [place], [place] [address]  Phone: [phone] M-F 7:30 a.m. - 5 p.m.   [facility]: [address], [place]  [place], [place]  Phone: [phone] M-F 8 a.m. - 4:30 p.m.   [place]: [address], [place], [place] [address]   Phone: [phone] M-F  7 a.m. - 5:30 p.m. Sat-Sun: 7 a.m. - noon               [facility] [address] [place], [place] [address] Phone [phone] M-F 8 a.m. - 4:30 p.m.   [facility] [place]: [address] [place], [place] [address] Phone [phone] M-Th  7 a.m. - 6:30 p.m. Fri: 7 a.m. - 5 p.m.   [facility]: [address] [place], [place] [address] Phone [phone] M-F  7 a.m. - 5:30 p.m.    Team [place] Providers:                                                                                                                                    Dr. Simmie Fairly, MD and Dr. Velva Pate, MD   Lonell Pinal, AGCNP-BC, [facility], HF-Cert                         Saddie Hope, APRN, ANP-C, [facility]                                          Romualdo Nelson, [place]                                                 Nurse Team: Candyce Parr, Ashlee Kehr, Mitzie Pump, [place] Poore, [facility] for Advanced [facility] Care at [facility]  [address] [place], [place] [address]  Phone: [phone] Fax: [phone]    At the [facility], you and your family are our top priority.  You may receive a survey via email or text message that we are asking you to complete.  We value your feedback to ensure you are satisfied with every visit and we are continually providing the highest quality of patient care.  We know your time is valuable and thank you in advance for completing the survey.                   Future Appointments   Date Time Provider Department Center   01/17/2024  3:30 PM Cleotilde Lonell CHRISTELLA ARDIA CVMSNCL CVM Exam   03/21/2024  1:20 PM Love, Elsie DASEN, MD MACSTJOECL CVM Exam          This note was dictated using the Dragon speech recognition software.  Transcription errors may occur with the use of this software. Editing and proofreading were done by the author of this document.  In spite of the author's best effort to identify every error introduced by voice to text dictation, errors may still be present.

## 2024-01-18 ENCOUNTER — Encounter: Admit: 2024-01-18 | Discharge: 2024-01-18 | Payer: MEDICARE

## 2024-01-18 DIAGNOSIS — D509 Iron deficiency anemia, unspecified: Principal | ICD-10-CM

## 2024-01-18 LAB — CBC AND DIFF
~~LOC~~ BKR ABSOLUTE BASO COUNT: 0.1 10*3/uL (ref 0.00–0.20)
~~LOC~~ BKR ABSOLUTE EOS COUNT: 0.2 10*3/uL (ref 0.00–0.45)
~~LOC~~ BKR ABSOLUTE LYMPH COUNT: 2.3 10*3/uL (ref 1.00–4.80)
~~LOC~~ BKR ABSOLUTE MONO COUNT: 0.5 10*3/uL (ref 0.00–0.80)
~~LOC~~ BKR ABSOLUTE NEUTROPHIL: 3.1 10*3/uL (ref 1.80–7.00)
~~LOC~~ BKR BASOPHILS %: 1.3 % (ref 0.0–2.0)
~~LOC~~ BKR EOSINOPHILS %: 2.7 % (ref 0.0–5.0)
~~LOC~~ BKR LYMPHOCYTES %: 37 % (ref 24.0–44.0)
~~LOC~~ BKR MCH: 26 pg (ref 26.0–34.0)
~~LOC~~ BKR MCHC: 31 g/dL — ABNORMAL LOW (ref 32.0–36.0)
~~LOC~~ BKR MCV: 84 fL (ref 80.0–100.0)
~~LOC~~ BKR MONOCYTES %: 8.6 % (ref 4.0–12.0)
~~LOC~~ BKR MPV: 7.9 fL (ref 7.0–11.0)
~~LOC~~ BKR NEUTROPHILS %: 50 % (ref 41.0–77.0)
~~LOC~~ BKR PLATELET COUNT: 207 10*3/uL (ref 150–400)
~~LOC~~ BKR RBC COUNT: 4 10*6/uL — ABNORMAL LOW (ref 4.40–5.50)
~~LOC~~ BKR RDW: 30 % — ABNORMAL HIGH (ref 11.0–15.0)
~~LOC~~ BKR WBC COUNT: 6.2 10*3/uL (ref 4.50–11.00)

## 2024-01-18 LAB — NT-PRO-BNP: ~~LOC~~ BKR NT-PRO-BNP: 349 pg/mL — ABNORMAL HIGH (ref 270–<450)

## 2024-01-18 LAB — IRON + BINDING CAPACITY + %SAT+ FERRITIN: ~~LOC~~ BKR % SATURATION: 11 % — ABNORMAL LOW (ref 28–42)

## 2024-02-04 ENCOUNTER — Encounter: Admit: 2024-02-04 | Discharge: 2024-02-04 | Payer: MEDICARE

## 2024-02-04 MED ORDER — TORSEMIDE 20 MG PO TAB
ORAL_TABLET | ORAL | 3 refills | 67.50000 days | Status: AC
Start: 2024-02-04 — End: ?

## 2024-03-06 ENCOUNTER — Encounter: Admit: 2024-03-06 | Discharge: 2024-03-06 | Payer: MEDICARE

## 2024-03-20 ENCOUNTER — Encounter: Admit: 2024-03-20 | Discharge: 2024-03-20 | Payer: MEDICARE

## 2024-03-21 ENCOUNTER — Encounter: Admit: 2024-03-21 | Discharge: 2024-03-21 | Payer: MEDICARE

## 2024-04-03 ENCOUNTER — Encounter: Admit: 2024-04-03 | Discharge: 2024-04-03 | Payer: MEDICARE

## 2024-04-03 ENCOUNTER — Ambulatory Visit: Admit: 2024-04-03 | Discharge: 2024-04-04 | Payer: MEDICARE

## 2024-04-03 MED ORDER — OZEMPIC 0.25 MG OR 0.5 MG (2 MG/3 ML) SC PNIJ
.25 mg | SUBCUTANEOUS | 0 refills | 28.00000 days | Status: AC
Start: 2024-04-03 — End: ?

## 2024-04-03 MED ORDER — PEN NEEDLE, DIABETIC 32 GAUGE X 5/32" MISC NDLE
1 | 0 refills | 90.00000 days | Status: AC | PRN
Start: 2024-04-03 — End: ?

## 2024-04-03 MED ORDER — OZEMPIC 0.25 MG OR 0.5 MG (2 MG/3 ML) SC PNIJ
.25 mg | SUBCUTANEOUS | 0 refills | 28.00000 days | Status: DC
Start: 2024-04-03 — End: 2024-04-03
  Filled 2024-04-09: qty 3, 56d supply, fill #0

## 2024-04-03 NOTE — Progress Notes
 Cardiology Clinic Pharmacist - New Patient Visit     An initial comprehensive medication management visit was completed today via telephone.    Referral reason: GLP1 Initiation and Titration (Cardiology)  Referring provider: Maurice Elsie DASEN, MD    Assessment & Plan     Obesity  The current weight is 110.8 kg and current BMI is 40.6 kg/m2.    Plan  -START semaglutide (Ozempic) 0.25 mg SQ every 7 days for T2DM/secondary prevention/obesity       -Prescription sent to [facility] to be completed by PPA team.       -Patient to follow-up in 6 weeks. 2 weeks for authorization and 4 weeks on 0.25 mg dose. Will plan to increase dose to 0.5 mg dose if patient has received/tolerated the medication.    Follow-up  The patient will continue to follow up with the pharmacist. Return to pharmacist in 6 weeks via telephone. The return visit was scheduled during today's visit.    Subjective & Objective    Patient reports that he was recently switched from Novolog  mixed insulin  to a generic and his BG readings have been very elevated. He measures his BG several times throughout the day. Both his AM fasting and 2 hour post-prandial readings are generally in the 200-300 range.    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)  Baseline weight: 110.8 kg  Baseline BMI: 40.6 kg/m2    Safety Assessment  History of pancreatitis: no  Patient or family history of medullary thyroid carcinoma or multiple neoplasia syndrome type 2: no    Labs and Diagnostic Tests  Lab Results   Component Value Date/Time    HGBA1C 7.6 (H) 04/11/2023 03:44 AM    HGBA1C 8.4 (H) 06/22/2020 12:00 AM    HGBA1C 7.5 (H) 12/22/2019 12:00 AM       Wt Readings from Last 3 Encounters:   03/21/24 110.8 kg (244 lb 3.2 oz)   01/17/24 114.2 kg (251 lb 12.8 oz)   12/17/23 115.5 kg (254 lb 9.6 oz)       Lab Results   Component Value Date/Time    NA 136 02/26/2024 12:00 AM    K 4.3 02/26/2024 12:00 AM    CL 100 02/26/2024 12:00 AM    CO2 30 02/26/2024 12:00 AM    GAP 6 02/26/2024 12:00 AM    BUN 10 02/26/2024 12:00 AM    CR 0.95 02/26/2024 12:00 AM    GLU 180 (H) 02/26/2024 12:00 AM       Lab Results   Component Value Date/Time    CA 8.4 02/26/2024 12:00 AM    PO4 3.8 04/10/2023 08:05 PM    ALBUMIN 3.3 (L) 02/26/2024 12:00 AM    TOTPROT 6.3 02/26/2024 12:00 AM    ALKPHOS 73 02/26/2024 12:00 AM    AST 37 (H) 02/26/2024 12:00 AM    ALT 28 02/26/2024 12:00 AM    TOTBILI 1.1 02/26/2024 12:00 AM    GFR 69 11/09/2023 12:00 AM    GFRAA >60 12/22/2019 12:00 AM       11/07/17 [facility]  [facility]     CARDIAC CATHETERIZATION REPORT  Page 1  Lavarr Settle  M            DOB:  May 15, 1941  [place]#: [id number]        [place] MR #/Billing ID #:  [id number] / [id number]  DATE:  11/07/2017  CARDIOLOGIST:                           ERIC GORMAN DARTING, MD  DICTATING PROVIDER:             ERIC GORMAN DARTING, MD  REFERRING PHYSICIAN:         ELLIS BERKOWITZ     INDICATIONS FOR PROCEDURE:  Severe angina, multivessel disease, turned down for surgery.     PROCEDURES PERFORMED:    1. Coronary angiography.  2. Left heart catheterization.  3. 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     PROCEDURAL DETAILS:  After obtaining informed consent, the patient was brought to the cardiac cath lab in a fasting, nonsedated state.  The right wrist was prepped and draped in the usual fashion.  We then used a Tiger 4 catheter and advanced it into the left ventricle for hemodynamic monitoring.  It was withdrawn into the aorta for repeat hemodynamics.  We then directed it into the left coronary for multiple views of the left coronary.  We then used it for the right coronary angiography.  After identifying a heavily calcified, tortuous complete occlusion of the right coronary, we elected to obtain access from the right femoral artery.  Because of the heavy calcification, I also recommended obtaining venous access for the possibility of using Rotablator and a pacemaker.  Using ultrasound, I was able to identify a suitable artery and vein.  Under direct visualization I was able to cannulate the artery and vein.  The micropuncture wire was placed and then upsized for standard wires.  A 5-French sheath was placed in the vein and a 7-French was placed in the artery.  We then advanced an AL0.75 guide to the right coronary artery.  Additional heparin was given.  A BMW wire was advanced down the artery and a 2.0 x 15 mm balloon was advanced and deployed for multiple inflations to 12 atmospheres.  We then placed a 2.5 x 15 over the long wire at 14 atmospheres twice.  We then placed a 3.0 x 15 NC balloon with multiple inflations 16 to 24 atmospheres.  We then attempted to cross the lesion with an OCT catheter, but it would not cross the lesion despite multiple inflations.  We then attempted to place a 3.25 x 23 mm Xience drug-eluting stent with the support of a 2nd wire and GuideLiner and it would not advance.  We then inflated with a 3.5 x 12 mm Trek NC and then we were able to advance a 3.5 x 24 mm Synergy stent.  It was post dilated up to 22 atmospheres with the 3.5 x 12 mm NC balloon.  Images revealed a possible dissection distal to the stent.  I then tried to deliver 2 different stents, 1 was a 3.0 x 18 and another 3.0 x 8, and neither one would deliver across the distal end of the 1st stent that was placed due to a significant tortuosity.  We therefore elected to stop the procedure at that point, given the amount of contrast and radiation exposure to the patient.  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      TIMI flow  preprocedure was TIMI flow 1.  TIMI flow postprocedure was TIMI-3.  ACC type was type C, high risk.     FINDINGS:       HEMODYNAMICS:    1. Aortic pressure 107/69, mean 85 mmHg.  2. Left ventricular pressure 137/EDP 22 mmHg.     ANATOMY:    1. Left main:  Arose from the left coronary cusp.  It bifurcates into the LAD and circumflex.  The left main has distal 30% narrowing.  2. Left anterior descending:  Has diffuse calcification and only mild irregularities in the range of 20% to 30% in the proximal and midportion.  It supplies 2 diagonals, which are free of any significant disease.  3. Circumflex:  Completely occluded in the midportion.  Proximal to that it supplies an obtuse marginal and then it bifurcates distal to the occlusion into a terminal bifurcating obtuse marginal.  4. Right coronary artery:  100% occluded proximally.  There was a trickle of flow with TIMI 1 flow through the lesion prior to intervention.  After this, there is a relatively high bifurcation and then there is a 90% followed by another 90% stenosis in the posterior descending artery.      After intervention, there was minimal residual stenosis in the stented portion, but there does appear to be a focal stable dissection distal to the stent.     CONTRAST:  Visipaque-320, a total of 240 mL.     RADIATION EXPOSURE:  Fluoroscopy time was 26.6 minutes with an air kerma total of 4867 mGy.     MEDICATIONS:  Lidocaine  30 mL total, fentanyl  200 mcg, heparin 10,000 units, nitroglycerin  200 mcg, verapamil 2.5 mg and Versed 2.5 mg in divided doses.     CONCLUSIONS:    1. 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  2. Mild disease of the LAD and left main.  3. Successful intervention of the right coronary, re-establishing normal flow.  There continues to be serial 90% stenoses in the distal right coronary artery, that are also heavily calcified.    Primary Care - Medication History    Home Medications   Medication Sig   acetaminophen  SR (TYLENOL  8 HOUR) 650 mg tablet Take one tablet by mouth as Needed for Pain. albuterol  0.5% (PROVENTIL ; VENTOLIN ) 2.5 mg/0.5 mL nebulizer solution Inhale one each solution by nebulizer as directed as Needed for Shortness of Breath or Wheezing.   albuterol  sulfate (PROAIR  HFA) 90 mcg/actuation HFA aerosol inhaler Inhale two puffs by mouth into the lungs as Needed for Wheezing or Shortness of Breath.   ALPRAZolam  (XANAX ) 0.25 mg tablet Take one tablet by mouth at bedtime as needed.   ascorbic acid (vitamin C) (VITAMIN C) 1,000 mg tablet Take one tablet by mouth daily.   aspirin  EC (ASPIR-LOW) 81 mg tablet Take one tablet by mouth daily.   atorvastatin  (LIPITOR) 80 mg tablet TAKE ONE TABLET BY MOUTH DAILY   bacitracin  zinc  500 unit/g topical ointment Apply  topically to affected area as Needed (as needed for comfort). Apply a small amount to head of penis/catheter 2 times daily to reduce irritation.  Indications: catheter lubrication   blood-glucose sensor (FREESTYLE LIBRE 3 SENSOR) sensor device Use one each as directed every 14 days.   calcium  carbonate-vitamin D3 (OS-CAL 500 + D) 1250 mg/200 unit tablet Take one tablet by mouth at bedtime daily. Calcium  Carb  1250mg  delivers 500mg  elemental Ca   cyanocobalamin  (vitamin B-12) 1,000 mcg tablet Take one tablet by mouth daily.   cyclobenzaprine  (FLEXERIL ) 5 mg tablet Take one tablet by mouth as Needed for Muscle Cramps.   diclofenac  sodium (VOLTAREN ) 1 % topical gel Apply four g topically to affected area four times daily as needed. Indications: pain   elderberry fruit 350 mg cap Take one capsule by mouth daily.   HYDROcodone /acetaminophen  (NORCO) 5/325 mg tablet Take one tablet by mouth as Needed for Pain.   hydrocortisone (HYTONE) 2.5 % topical cream Apply  topically to affected area as Needed.   insulin  aspart 70/30 (+) (NOVOLOG  MIX 70/30) 100 unit/mL (70-30) Inject eighty Units under the skin twice daily. Sometimes will drop evening dose to 60-70u if BG is low   isosorbide  mononitrate ER (IMDUR ) 30 mg tablet,extended release 24 hr Take one tablet by mouth every morning.   magnesium  oxide (PHILLIPS) 500 mg magnesium  tablet Take one tablet by mouth at bedtime daily.   metOLazone  (ZAROXOLYN ) 5 mg tablet Take one tablet by mouth every 7 days.   metoprolol  succinate XL (TOPROL  XL) 25 mg extended release tablet Take one tablet by mouth twice daily. Indications: .   montelukast  (SINGULAIR ) 10 mg tablet Take one tablet by mouth at bedtime daily.   multivitamin (ONE-A-DAY) tablet Take one tablet by mouth daily.   nitroglycerin  (NITROSTAT ) 0.4 mg tablet Place one tablet under tongue every 5 minutes as needed for Chest Pain. Max of 3 tablets, call 911.   pantoprazole  DR (PROTONIX ) 40 mg tablet Take one tablet by mouth twice daily.   polyethylene glycol 3350  (MIRALAX ) 17 gram/dose powder Take seventeen g by mouth daily. Indications: constipation   potassium chloride  SR (K-DUR) 20 mEq tablet Take two tablets by mouth twice daily. Take with a meal and a full glass of water .  Indications: prevention of low potassium in the blood   rivaroxaban  (XARELTO ) 20 mg tablet Take one tablet by mouth daily. Take with food.   sennosides-docusate sodium  (SENOKOT-S) 8.6/50 mg tablet Take one tablet by mouth as Needed.   spironolactone  (ALDACTONE ) 50 mg tablet Take one tablet by mouth daily. take with food   SYMBICORT  80-4.5 mcg/actuation aerosol inhaler Inhale two puffs by mouth into the lungs twice daily.   tiotropium bromide  (SPIRIVA ) 18 mcg capsule for inhaler Place one capsule into inhaler and inhale into lungs as directed daily.   tiZANidine  (ZANAFLEX ) 2 mg tablet Take one tablet by mouth every 8 hours as needed. Indications: muscle spasm  Patient taking differently: Take one tablet by mouth as Needed. Indications: muscle spasm   torsemide  (DEMADEX ) 20 mg tablet Take 60mg  twice daily. Take by mouth.  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      Education  Education provided: yes    GLP-1 Agonist Teaching - the following topics were discussed:     Overview:  - Storage of medication  - Expiration of medication  - Review of pen (different components)  - What medication should look like  - How often to administer  - Side effects  - Importance of checking blood sugar, if applicable     Supplies:  - Clean injection site with alcohol swab before each injection  - Use a new needle with each injection (Ozempic, Victoza), use a new pen if autoinjector (Mounjaro, Trulicity, [place])   - Obtain a sharps container or similar for pen needle disposal    Getting Started:  - Perform flow check before  first time using a new pen (Ozempic, Saxenda, Victoza)  - Dial to dose prescribed (Ozempic, Saxenda, Victoza)   - Uncap pen ([place], [place], [place])     Injection Technique:  - Injection location (abdomen preferred, thigh or back of arm if needed)  - Avoid tattoos, scars, stretch marks, sores  - Inject at 90 degree angle  - Press and hold injection button (Mounjaro, Trulicity)  - [employer] firmly against skin to inject [facility])  - After inserting needle into skin, push all the way down on button to administer dose (Ozempic, Saxenda, Victoza)   - Listen for clicks to indicate dose has been administered if applicable  - Leave needle inserted for 5-10 seconds to allow for absorption (Ozempic, Saxenda, Victoza) or until audible click indicating needle is retracted (Mounjaro, Trulicity, [place])  - Pull needle straight out of skin (Ozempic, Saxenda, Victoza)    Woodie Shove, Pharm.D., BCACP  Ambulatory Clinical Pharmacist - Cardiology  [facility]

## 2024-04-03 NOTE — Patient Instructions
 I have sent a prescription for semaglutide (Ozempic) 0.25 mg to the [facility]. We will begin working with your insurance to complete the prior authorization (PA) process that we discussed today. If the medication is approved they will ship it directly to your home.  I have included a link on how to use this medication and a video below:    [email or web address]     If you have any questions or concerns please call my direct line at [phone]. If I do not answer please leave a message and I will call you back as soon as possible.     Dayrin Stallone, Pharm.D., BCACP  Ambulatory Clinical Pharmacist - Cardiology  [facility]

## 2024-04-04 ENCOUNTER — Encounter: Admit: 2024-04-04 | Discharge: 2024-04-04 | Payer: MEDICARE

## 2024-04-04 DIAGNOSIS — I272 Pulmonary hypertension, unspecified: Principal | ICD-10-CM

## 2024-04-04 DIAGNOSIS — I25118 Atherosclerotic heart disease of native coronary artery with other forms of angina pectoris: Secondary | ICD-10-CM

## 2024-04-04 DIAGNOSIS — I10 Essential (primary) hypertension: Secondary | ICD-10-CM

## 2024-04-04 DIAGNOSIS — E782 Mixed hyperlipidemia: Secondary | ICD-10-CM

## 2024-04-04 DIAGNOSIS — I2089 Other forms of angina pectoris: Secondary | ICD-10-CM

## 2024-04-04 DIAGNOSIS — I482 Chronic atrial fibrillation, unspecified: Secondary | ICD-10-CM

## 2024-04-04 NOTE — Progress Notes
 Pharmacy Benefits Investigation    Medication name: semaglutide (OZEMPIC) 0.25 mg or 0.5 mg (2 mg/3 mL) injection PEN  Medication status: new    The out of pocket cost today is $222.96 for 56 days. This cost may change due to factors including but not limited to changes in insurance coverage.    Copay assistance is not available. All available forms of copay assistance were evaluated and none are currently available for Ian Velez.    Ian Velez stated the copay is affordable. Per patient's request, the medication will be shipped to the patient's address.      [facility]  Specialty Pharmacy Patient Advocate

## 2024-04-05 ENCOUNTER — Encounter: Admit: 2024-04-05 | Discharge: 2024-04-05 | Payer: MEDICARE

## 2024-04-05 MED FILL — PEN NEEDLE, DIABETIC 32 GAUGE X 5/32" MISC NDLE: 32 gauge x 5/" | 90 days supply | Qty: 100 | Fill #0 | Status: AC

## 2024-04-06 ENCOUNTER — Encounter: Admit: 2024-04-06 | Discharge: 2024-04-06 | Payer: MEDICARE

## 2024-04-08 ENCOUNTER — Encounter: Admit: 2024-04-08 | Discharge: 2024-04-08 | Payer: MEDICARE

## 2024-04-09 ENCOUNTER — Encounter: Admit: 2024-04-09 | Discharge: 2024-04-09 | Payer: MEDICARE

## 2024-04-14 ENCOUNTER — Encounter: Admit: 2024-04-14 | Discharge: 2024-04-14 | Payer: MEDICARE

## 2024-04-28 ENCOUNTER — Encounter: Admit: 2024-04-28 | Discharge: 2024-04-28 | Payer: MEDICARE

## 2024-05-05 NOTE — Progress Notes [1]
 Cardiology Clinic Pharmacist - New Patient Visit     An initial comprehensive medication management visit was completed today via telephone.    Referral reason: GLP1 Initiation and Titration (Cardiology)  Referring provider: Maurice Elsie DASEN, MD    Assessment & Plan     Obesity    Plan  -INCREASE semaglutide (Ozempic) 0.25 mg -->> 0.5 mg SQ every 7 days for T2DM/secondary prevention/obesity       -Patient to follow-up in 3-4 weeks. Will plan to increase dose to 1 mg dose if patient has received/tolerated the medication.    Follow-up  The patient will continue to follow up with the pharmacist. Return to pharmacist in 3 weeks via telephone. The return visit was scheduled during today's visit.    Subjective & Objective    05/05/24 -  Ian Velez reports that he is generally feeling well. He experienced some nausea and vomiting with his first dose of the semaglutide (Ozempic) 0.25 mg, but has tolerated the last 3 doses without issue. He has been using a [employer] and his BG readings have been in the 130-300 range depending on whether the readings were taken fasting or post-prandial. He denies any episodes of hypoglycemia. His weight is stable, but he does endorse a reduced appetite after starting GLP-1 RA therapy.    04/03/24 - Patient reports that he was recently switched from Novolog  mixed insulin  to a generic and his BG readings have been very elevated. He measures his BG several times throughout the day. Both his AM fasting and 2 hour post-prandial readings are generally in the 200-300 range.    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)  Baseline weight: 110.8 kg  Baseline BMI: 40.6 kg/m2    Safety Assessment  History of pancreatitis: no  Patient or family history of medullary thyroid carcinoma or multiple neoplasia syndrome type 2: no    Labs and Diagnostic Tests  Lab Results   Component Value Date/Time    HGBA1C 7.6 (H) 04/11/2023 03:44 AM    HGBA1C 8.4 (H) 06/22/2020 12:00 AM    HGBA1C 7.5 (H) 12/22/2019 12:00 AM       Wt Readings from Last 3 Encounters:   03/21/24 110.8 kg (244 lb 3.2 oz)   01/17/24 114.2 kg (251 lb 12.8 oz)   12/17/23 115.5 kg (254 lb 9.6 oz)       Lab Results   Component Value Date/Time    NA 136 02/26/2024 12:00 AM    K 4.3 02/26/2024 12:00 AM    CL 100 02/26/2024 12:00 AM    CO2 30 02/26/2024 12:00 AM    GAP 6 02/26/2024 12:00 AM    BUN 10 02/26/2024 12:00 AM    CR 0.95 02/26/2024 12:00 AM    GLU 180 (H) 02/26/2024 12:00 AM       Lab Results   Component Value Date/Time    CA 8.4 02/26/2024 12:00 AM    PO4 3.8 04/10/2023 08:05 PM    ALBUMIN 3.3 (L) 02/26/2024 12:00 AM    TOTPROT 6.3 02/26/2024 12:00 AM    ALKPHOS 73 02/26/2024 12:00 AM    AST 37 (H) 02/26/2024 12:00 AM    ALT 28 02/26/2024 12:00 AM    TOTBILI 1.1 02/26/2024 12:00 AM    GFR 69 11/09/2023 12:00 AM    GFRAA >60 12/22/2019 12:00 AM       11/07/17 [facility]  [facility]  [facility]     CARDIAC CATHETERIZATION REPORT  Page 1  Ian Velez                                                               M            DOB:  Apr 09, 1941  [place]#: [id number]        [place] MR #/Billing ID #:  [id number] / [id number]  DATE:  11/07/2017  CARDIOLOGIST:                           ERIC GORMAN DARTING, MD  DICTATING PROVIDER:             ERIC GORMAN DARTING, MD  REFERRING PHYSICIAN:         ELLIS BERKOWITZ     INDICATIONS FOR PROCEDURE:  Severe angina, multivessel disease, turned down for surgery.     PROCEDURES PERFORMED:    1. Coronary angiography.  2. Left heart catheterization.  3. 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     PROCEDURAL DETAILS:  After obtaining informed consent, the patient was brought to the cardiac cath lab in a fasting, nonsedated state.  The right wrist was prepped and draped in the usual fashion.  We then used a Tiger 4 catheter and advanced it into the left ventricle for hemodynamic monitoring.  It was withdrawn into the aorta for repeat hemodynamics.  We then directed it into the left coronary for multiple views of the left coronary.  We then used it for the right coronary angiography.  After identifying a heavily calcified, tortuous complete occlusion of the right coronary, we elected to obtain access from the right femoral artery.  Because of the heavy calcification, I also recommended obtaining venous access for the possibility of using Rotablator and a pacemaker.  Using ultrasound, I was able to identify a suitable artery and vein.  Under direct visualization I was able to cannulate the artery and vein.  The micropuncture wire was placed and then upsized for standard wires.  A 5-French sheath was placed in the vein and a 7-French was placed in the artery.  We then advanced an AL0.75 guide to the right coronary artery.  Additional heparin was given.  A BMW wire was advanced down the artery and a 2.0 x 15 mm balloon was advanced and deployed for multiple inflations to 12 atmospheres.  We then placed a 2.5 x 15 over the long wire at 14 atmospheres twice.  We then placed a 3.0 x 15 NC balloon with multiple inflations 16 to 24 atmospheres.  We then attempted to cross the lesion with an OCT catheter, but it would not cross the lesion despite multiple inflations.  We then attempted to place a 3.25 x 23 mm Xience drug-eluting stent with the support of a 2nd wire and GuideLiner and it would not advance.  We then inflated with a 3.5 x 12 mm Trek NC and then we were able to advance a 3.5 x 24 mm Synergy stent.  It was post dilated up to 22 atmospheres with the 3.5 x 12 mm NC balloon.  Images revealed a possible dissection distal to the stent.  I then tried to  deliver 2 different stents, 1 was a 3.0 x 18 and another 3.0 x 8, and neither one would deliver across the distal end of the 1st stent that was placed due to a significant tortuosity.  We therefore elected to stop the procedure at that point, given the amount of contrast and radiation exposure to the patient.  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      TIMI flow preprocedure was TIMI flow 1.  TIMI flow postprocedure was TIMI-3.  ACC type was type C, high risk.     FINDINGS:       HEMODYNAMICS:    1. Aortic pressure 107/69, mean 85 mmHg.  2. Left ventricular pressure 137/EDP 22 mmHg.     ANATOMY:    1. Left main:  Arose from the left coronary cusp.  It bifurcates into the LAD and circumflex.  The left main has distal 30% narrowing.  2. Left anterior descending:  Has diffuse calcification and only mild irregularities in the range of 20% to 30% in the proximal and midportion.  It supplies 2 diagonals, which are free of any significant disease.  3. Circumflex:  Completely occluded in the midportion.  Proximal to that it supplies an obtuse marginal and then it bifurcates distal to the occlusion into a terminal bifurcating obtuse marginal.  4. Right coronary artery:  100% occluded proximally.  There was a trickle of flow with TIMI 1 flow through the lesion prior to intervention.  After this, there is a relatively high bifurcation and then there is a 90% followed by another 90% stenosis in the posterior descending artery.      After intervention, there was minimal residual stenosis in the stented portion, but there does appear to be a focal stable dissection distal to the stent.     CONTRAST:  Visipaque-320, a total of 240 mL.     RADIATION EXPOSURE:  Fluoroscopy time was 26.6 minutes with an air kerma total of 4867 mGy.     MEDICATIONS:  Lidocaine  30 mL total, fentanyl  200 mcg, heparin 10,000 units, nitroglycerin  200 mcg, verapamil 2.5 mg and Versed 2.5 mg in divided doses.     CONCLUSIONS:    1. 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  2. Mild disease of the LAD and left main.  3. Successful intervention of the right coronary, re-establishing normal flow.  There continues to be serial 90% stenoses in the distal right coronary artery, that are also heavily calcified.    Home Medications   Medication Sig   acetaminophen  SR (TYLENOL  8 HOUR) 650 mg tablet Take one tablet by mouth as Needed for Pain.   albuterol  0.5% (PROVENTIL ; VENTOLIN ) 2.5 mg/0.5 mL nebulizer solution Inhale one each solution by nebulizer as directed as Needed for Shortness of Breath or Wheezing.   albuterol  sulfate (PROAIR  HFA) 90 mcg/actuation HFA aerosol inhaler Inhale two puffs by mouth into the lungs as Needed for Wheezing or Shortness of Breath.   ALPRAZolam  (XANAX ) 0.25 mg tablet Take one tablet by mouth at bedtime as needed.   ascorbic acid (vitamin C) (VITAMIN C) 1,000 mg tablet Take one tablet by mouth daily.   aspirin  EC (ASPIR-LOW) 81 mg tablet Take one tablet by mouth daily.   atorvastatin  (LIPITOR) 80 mg tablet TAKE ONE TABLET BY MOUTH DAILY   bacitracin  zinc  500 unit/g topical  ointment Apply  topically to affected area as Needed (as needed for comfort). Apply a small amount to head of penis/catheter 2 times daily to reduce irritation.  Indications: catheter lubrication   blood-glucose sensor (FREESTYLE LIBRE 3 SENSOR) sensor device Use one each as directed every 14 days.   calcium  carbonate-vitamin D3 (OS-CAL 500 + D) 1250 mg/200 unit tablet Take one tablet by mouth at bedtime daily. Calcium  Carb 1250mg  delivers 500mg  elemental Ca   cyanocobalamin  (vitamin B-12) 1,000 mcg tablet Take one tablet by mouth daily.   cyclobenzaprine  (FLEXERIL ) 5 mg tablet Take one tablet by mouth as Needed for Muscle Cramps.   diclofenac  sodium (VOLTAREN ) 1 % topical gel Apply four g topically to affected area four times daily as needed. Indications: pain   elderberry fruit 350 mg cap Take one capsule by mouth daily.   HYDROcodone /acetaminophen  (NORCO) 5/325 mg tablet Take one tablet by mouth as Needed for Pain.   hydrocortisone (HYTONE) 2.5 % topical cream Apply topically to affected area as Needed.   insulin  aspart 70/30 (+) (NOVOLOG  MIX 70/30) 100 unit/mL (70-30) Inject eighty Units under the skin twice daily. Sometimes will drop evening dose to 60-70u if BG is low   isosorbide  mononitrate ER (IMDUR ) 30 mg tablet,extended release 24 hr Take one tablet by mouth every morning.   magnesium  oxide (PHILLIPS) 500 mg magnesium  tablet Take one tablet by mouth at bedtime daily.   metOLazone  (ZAROXOLYN ) 5 mg tablet Take one tablet by mouth every 7 days.   metoprolol  succinate XL (TOPROL  XL) 25 mg extended release tablet Take one tablet by mouth twice daily. Indications: .   montelukast  (SINGULAIR ) 10 mg tablet Take one tablet by mouth at bedtime daily.   multivitamin (ONE-A-DAY) tablet Take one tablet by mouth daily.   nitroglycerin  (NITROSTAT ) 0.4 mg tablet Place one tablet under tongue every 5 minutes as needed for Chest Pain. Max of 3 tablets, call 911.   pantoprazole  DR (PROTONIX ) 40 mg tablet Take one tablet by mouth twice daily.   pen needle, diabetic (BD NANO 2ND GEN PEN NEEDLE) 32 gauge x 5/32 pen needle Use one each as directed as Needed.   polyethylene glycol 3350  (MIRALAX ) 17 gram/dose powder Take seventeen g by mouth daily. Indications: constipation   potassium chloride  SR (K-DUR) 20 mEq tablet Take two tablets by mouth twice daily. Take with a meal and a full glass of water .  Indications: prevention of low potassium in the blood   rivaroxaban  (XARELTO ) 20 mg tablet Take one tablet by mouth daily. Take with food.   semaglutide (OZEMPIC) 0.25 mg or 0.5 mg (2 mg/3 mL) injection PEN Inject one-quarter mg under the skin every 7 days. Indications: type 2 diabetes mellitus   sennosides-docusate sodium  (SENOKOT-S) 8.6/50 mg tablet Take one tablet by mouth as Needed.   spironolactone  (ALDACTONE ) 50 mg tablet Take one tablet by mouth daily. take with food   SYMBICORT  80-4.5 mcg/actuation aerosol inhaler Inhale two puffs by mouth into the lungs twice daily.   tiotropium bromide  (SPIRIVA ) 18 mcg capsule for inhaler Place one capsule into inhaler and inhale into lungs as directed daily.   tiZANidine  (ZANAFLEX ) 2 mg tablet Take one tablet by mouth every 8 hours as needed. Indications: muscle spasm  Patient taking differently: Take one tablet by mouth as Needed. Indications: muscle spasm   torsemide  (DEMADEX ) 20 mg tablet Take 60mg  twice daily. Take by mouth.  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

## 2024-05-06 ENCOUNTER — Ambulatory Visit: Admit: 2024-05-06 | Discharge: 2024-05-07 | Payer: MEDICARE

## 2024-05-06 ENCOUNTER — Encounter: Admit: 2024-05-06 | Discharge: 2024-05-06 | Payer: MEDICARE

## 2024-05-06 MED ORDER — OZEMPIC 0.25 MG OR 0.5 MG (2 MG/3 ML) SC PNIJ
.5 mg | SUBCUTANEOUS | 0 refills | 28.00000 days | Status: AC
Start: 2024-05-06 — End: ?
  Filled 2024-05-09: qty 3, 28d supply, fill #0

## 2024-05-07 ENCOUNTER — Encounter: Admit: 2024-05-07 | Discharge: 2024-05-07 | Payer: MEDICARE

## 2024-05-08 ENCOUNTER — Encounter: Admit: 2024-05-08 | Discharge: 2024-05-08 | Payer: MEDICARE

## 2024-05-09 ENCOUNTER — Encounter: Admit: 2024-05-09 | Discharge: 2024-05-09 | Payer: MEDICARE

## 2024-05-15 ENCOUNTER — Encounter: Admit: 2024-05-15 | Discharge: 2024-05-15 | Payer: MEDICARE

## 2024-05-26 NOTE — Progress Notes [1]
 Cardiology Clinic Pharmacist - Follow-up Patient Visit     A follow-up comprehensive medication management visit was completed today via telephone.    Referral reason: GLP1 Initiation and Titration (Cardiology)  Referring provider: Maurice Elsie DASEN, MD    Assessment & Plan     Obesity    Plan  -INCREASE semaglutide  (Ozempic ) 0.5 mg -->> 1 mg SQ every 7 days for T2DM/secondary prevention/obesity       -Patient to follow-up in 7 weeks. He has 4 doses of the 0.5 mg left, then will switch to the 1 mg dose on 06/30/24.        -Will plan to increase dose to 1.7 mg dose if patient has received/tolerated the medication.    Follow-up  The patient will continue to follow up with the pharmacist. Return to pharmacist in 7 weeks via telephone. The return visit was scheduled during today's visit.    Subjective & Objective    05/27/24 - Davontay reports that he is generally feeling well. He increased the semaglutide  (Ozempic ) to the 0.5 mg dose as instructed. He has given two doses of the 0.5 mg and denies side effects. He wears a CGM and reports that his 7 day average BG has been 173 and 14 day average is 182. He denies any episodes of hypoglycemia.    05/05/24 GLENWOOD Tanda reports that he is generally feeling well. He experienced some nausea and vomiting with his first dose of the semaglutide  (Ozempic ) 0.25 mg, but has tolerated the last 3 doses without issue. He has been using a [employer] and his BG readings have been in the 130-300 range depending on whether the readings were taken fasting or post-prandial. He denies any episodes of hypoglycemia. His weight is stable, but he does endorse a reduced appetite after starting GLP-1 RA therapy.    04/03/24 - Patient reports that he was recently switched from Novolog  mixed insulin  to a generic and his BG readings have been very elevated. He measures his BG several times throughout the day. Both his AM fasting and 2 hour post-prandial readings are generally in the 200-300 range.    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)  Baseline weight: 110.8 kg  Baseline BMI: 40.6 kg/m2    Safety Assessment  History of pancreatitis: no  Patient or family history of medullary thyroid carcinoma or multiple neoplasia syndrome type 2: no    Labs and Diagnostic Tests  Lab Results   Component Value Date/Time    HGBA1C 7.6 (H) 04/11/2023 03:44 AM    HGBA1C 8.4 (H) 06/22/2020 12:00 AM    HGBA1C 7.5 (H) 12/22/2019 12:00 AM       Wt Readings from Last 3 Encounters:   03/21/24 110.8 kg (244 lb 3.2 oz)   01/17/24 114.2 kg (251 lb 12.8 oz)   12/17/23 115.5 kg (254 lb 9.6 oz)       Lab Results   Component Value Date/Time    NA 136 02/26/2024 12:00 AM    K 4.3 02/26/2024 12:00 AM    CL 100 02/26/2024 12:00 AM    CO2 30 02/26/2024 12:00 AM    GAP 6 02/26/2024 12:00 AM    BUN 10 02/26/2024 12:00 AM    CR 0.95 02/26/2024 12:00 AM    GLU 180 (H) 02/26/2024 12:00 AM       Lab Results   Component Value Date/Time    CA 8.4 02/26/2024  12:00 AM    PO4 3.8 04/10/2023 08:05 PM    ALBUMIN 3.3 (L) 02/26/2024 12:00 AM    TOTPROT 6.3 02/26/2024 12:00 AM    ALKPHOS 73 02/26/2024 12:00 AM    AST 37 (H) 02/26/2024 12:00 AM    ALT 28 02/26/2024 12:00 AM    TOTBILI 1.1 02/26/2024 12:00 AM    GFR 69 11/09/2023 12:00 AM    GFRAA >60 12/22/2019 12:00 AM       11/07/17 [facility]  [facility]     CARDIAC CATHETERIZATION REPORT  Page 1  Johntavious Guiffre                                                               M            DOB:  05-26-1941  English#: [id number]        [facility] MR #/Billing ID #:  [id number] / [id number]  DATE:  11/07/2017  CARDIOLOGIST:                           ERIC GORMAN DARTING, MD  DICTATING PROVIDER:             ERIC GORMAN DARTING, MD  REFERRING PHYSICIAN:         ELLIS BERKOWITZ     INDICATIONS FOR PROCEDURE:  Severe angina, multivessel disease, turned down for surgery.     PROCEDURES PERFORMED:    1. Coronary angiography.  2. Left heart catheterization.  3. 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     PROCEDURAL DETAILS:  After obtaining informed consent, the patient was brought to the cardiac cath lab in a fasting, nonsedated state.  The right wrist was prepped and draped in the usual fashion.  We then used a Tiger 4 catheter and advanced it into the left ventricle for hemodynamic monitoring.  It was withdrawn into the aorta for repeat hemodynamics.  We then directed it into the left coronary for multiple views of the left coronary.  We then used it for the right coronary angiography.  After identifying a heavily calcified, tortuous complete occlusion of the right coronary, we elected to obtain access from the right femoral artery.  Because of the heavy calcification, I also recommended obtaining venous access for the possibility of using Rotablator and a pacemaker.  Using ultrasound, I was able to identify a suitable artery and vein.  Under direct visualization I was able to cannulate the artery and vein.  The micropuncture wire was placed and then upsized for standard wires.  A 5-French sheath was placed in the vein and a 7-French was placed in the artery.  We then advanced an AL0.75 guide to the right coronary artery.  Additional heparin was given.  A BMW wire was advanced down the artery and a 2.0 x 15 mm balloon was advanced and deployed for multiple inflations to 12 atmospheres.  We then placed a 2.5 x 15 over the long wire at 14 atmospheres twice.  We then placed a 3.0 x 15 NC balloon with multiple inflations 16 to 24 atmospheres.  We then attempted to cross the lesion with an OCT catheter, but it would not cross  the lesion despite multiple inflations.  We then attempted to place a 3.25 x 23 mm Xience drug-eluting stent with the support of a 2nd wire and GuideLiner and it would not advance.  We then inflated with a 3.5 x 12 mm Trek NC and then we were able to advance a 3.5 x 24 mm Synergy stent. It was post dilated up to 22 atmospheres with the 3.5 x 12 mm NC balloon.  Images revealed a possible dissection distal to the stent.  I then tried to deliver 2 different stents, 1 was a 3.0 x 18 and another 3.0 x 8, and neither one would deliver across the distal end of the 1st stent that was placed due to a significant tortuosity.  We therefore elected to stop the procedure at that point, given the amount of contrast and radiation exposure to the patient.  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      TIMI flow preprocedure was TIMI flow 1.  TIMI flow postprocedure was TIMI-3.  ACC type was type C, high risk.     FINDINGS:       HEMODYNAMICS:    1. Aortic pressure 107/69, mean 85 mmHg.  2. Left ventricular pressure 137/EDP 22 mmHg.     ANATOMY:    1. Left main:  Arose from the left coronary cusp.  It bifurcates into the LAD and circumflex.  The left main has distal 30% narrowing.  2. Left anterior descending:  Has diffuse calcification and only mild irregularities in the range of 20% to 30% in the proximal and midportion.  It supplies 2 diagonals, which are free of any significant disease.  3. Circumflex:  Completely occluded in the midportion.  Proximal to that it supplies an obtuse marginal and then it bifurcates distal to the occlusion into a terminal bifurcating obtuse marginal.  4. Right coronary artery:  100% occluded proximally.  There was a trickle of flow with TIMI 1 flow through the lesion prior to intervention.  After this, there is a relatively high bifurcation and then there is a 90% followed by another 90% stenosis in the posterior descending artery.      After intervention, there was minimal residual stenosis in the stented portion, but there does appear to be a focal stable dissection distal to the stent.     CONTRAST:  Visipaque-320, a total of 240 mL.     RADIATION EXPOSURE:  Fluoroscopy time was 26.6 minutes with an air kerma total of 4867 mGy. MEDICATIONS:  Lidocaine  30 mL total, fentanyl  200 mcg, heparin 10,000 units, nitroglycerin  200 mcg, verapamil 2.5 mg and Versed 2.5 mg in divided doses.     CONCLUSIONS:    1. 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  2. Mild disease of the LAD and left main.  3. Successful intervention of the right coronary, re-establishing normal flow.  There continues to be serial 90% stenoses in the distal right coronary artery, that are also heavily calcified.    Home Medications   Medication Sig   acetaminophen  SR (TYLENOL  8 HOUR) 650 mg tablet Take one tablet by mouth as Needed for Pain.   albuterol  0.5% (PROVENTIL ; VENTOLIN ) 2.5 mg/0.5 mL nebulizer solution Inhale one each solution by nebulizer as directed as Needed for Shortness of Breath or Wheezing.   albuterol  sulfate (PROAIR  HFA) 90 mcg/actuation  HFA aerosol inhaler Inhale two puffs by mouth into the lungs as Needed for Wheezing or Shortness of Breath.   ALPRAZolam  (XANAX ) 0.25 mg tablet Take one tablet by mouth at bedtime as needed.   ascorbic acid (vitamin C) (VITAMIN C) 1,000 mg tablet Take one tablet by mouth daily.   aspirin  EC (ASPIR-LOW) 81 mg tablet Take one tablet by mouth daily.   atorvastatin  (LIPITOR) 80 mg tablet TAKE ONE TABLET BY MOUTH DAILY   bacitracin  zinc  500 unit/g topical ointment Apply  topically to affected area as Needed (as needed for comfort). Apply a small amount to head of penis/catheter 2 times daily to reduce irritation.  Indications: catheter lubrication   blood-glucose sensor (FREESTYLE LIBRE 3 SENSOR) sensor device Use one each as directed every 14 days.   calcium  carbonate-vitamin D3 (OS-CAL 500 + D) 1250 mg/200 unit tablet Take one tablet by mouth at bedtime daily. Calcium  Carb 1250mg  delivers 500mg  elemental Ca   cyanocobalamin  (vitamin B-12) 1,000 mcg tablet Take one tablet by mouth daily. cyclobenzaprine  (FLEXERIL ) 5 mg tablet Take one tablet by mouth as Needed for Muscle Cramps.   diclofenac  sodium (VOLTAREN ) 1 % topical gel Apply four g topically to affected area four times daily as needed. Indications: pain   elderberry fruit 350 mg cap Take one capsule by mouth daily.   HYDROcodone /acetaminophen  (NORCO) 5/325 mg tablet Take one tablet by mouth as Needed for Pain.   hydrocortisone (HYTONE) 2.5 % topical cream Apply  topically to affected area as Needed.   insulin  aspart 70/30 (+) (NOVOLOG  MIX 70/30) 100 unit/mL (70-30) Inject eighty Units under the skin twice daily. Sometimes will drop evening dose to 60-70u if BG is low   isosorbide  mononitrate ER (IMDUR ) 30 mg tablet,extended release 24 hr Take one tablet by mouth every morning.   magnesium  oxide (PHILLIPS) 500 mg magnesium  tablet Take one tablet by mouth at bedtime daily.   metOLazone  (ZAROXOLYN ) 5 mg tablet Take one tablet by mouth every 7 days.   metoprolol  succinate XL (TOPROL  XL) 25 mg extended release tablet Take one tablet by mouth twice daily. Indications: .   montelukast  (SINGULAIR ) 10 mg tablet Take one tablet by mouth at bedtime daily.   multivitamin (ONE-A-DAY) tablet Take one tablet by mouth daily.   nitroglycerin  (NITROSTAT ) 0.4 mg tablet Place one tablet under tongue every 5 minutes as needed for Chest Pain. Max of 3 tablets, call 911.   pantoprazole  DR (PROTONIX ) 40 mg tablet Take one tablet by mouth twice daily.   pen needle, diabetic (BD NANO 2ND GEN PEN NEEDLE) 32 gauge x 5/32 pen needle Use one each as directed as Needed.   polyethylene glycol 3350  (MIRALAX ) 17 gram/dose powder Take seventeen g by mouth daily. Indications: constipation   potassium chloride  SR (K-DUR) 20 mEq tablet Take two tablets by mouth twice daily. Take with a meal and a full glass of water .  Indications: prevention of low potassium in the blood   rivaroxaban  (XARELTO ) 20 mg tablet Take one tablet by mouth daily. Take with food.   semaglutide  (OZEMPIC ) 0.25 mg or 0.5 mg (2 mg/3 mL) injection PEN Inject one-half mg under the skin every 7 days. Indications: type 2 diabetes mellitus   sennosides-docusate sodium  (SENOKOT-S) 8.6/50 mg tablet Take one tablet by mouth as Needed.   spironolactone  (ALDACTONE ) 50 mg tablet Take one tablet by mouth daily. take with food   SYMBICORT  80-4.5 mcg/actuation aerosol inhaler Inhale two puffs by mouth into the lungs twice daily.  tiotropium bromide  (SPIRIVA ) 18 mcg capsule for inhaler Place one capsule into inhaler and inhale into lungs as directed daily.   tiZANidine  (ZANAFLEX ) 2 mg tablet Take one tablet by mouth every 8 hours as needed. Indications: muscle spasm  Patient taking differently: Take one tablet by mouth as Needed. Indications: muscle spasm   torsemide  (DEMADEX ) 20 mg tablet Take 60mg  twice daily. Take by mouth.  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

## 2024-05-27 ENCOUNTER — Ambulatory Visit: Admit: 2024-05-27 | Discharge: 2024-05-28 | Payer: MEDICARE

## 2024-05-27 ENCOUNTER — Encounter: Admit: 2024-05-27 | Discharge: 2024-05-27 | Payer: MEDICARE

## 2024-05-27 MED ORDER — OZEMPIC 1 MG/DOSE (4 MG/3 ML) SC PNIJ
1 mg | SUBCUTANEOUS | 0 refills | 28.00000 days | Status: AC
Start: 2024-05-27 — End: ?
  Filled 2024-06-03: qty 3, 28d supply, fill #0

## 2024-05-31 ENCOUNTER — Encounter: Admit: 2024-05-31 | Discharge: 2024-05-31 | Payer: MEDICARE

## 2024-06-01 ENCOUNTER — Encounter: Admit: 2024-06-01 | Discharge: 2024-06-01 | Payer: MEDICARE

## 2024-06-02 ENCOUNTER — Encounter: Admit: 2024-06-02 | Discharge: 2024-06-02 | Payer: MEDICARE

## 2024-06-24 ENCOUNTER — Encounter: Admit: 2024-06-24 | Discharge: 2024-06-24 | Payer: MEDICARE

## 2024-06-24 NOTE — Progress Notes [1]
 Date of Service: 06/30/2024    Ian Velez is a 83 y.o. male.       HPI     I had the pleasure of seeing Ian Velez in clinic today for ongoing care.     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  Primary cardiologist Dr. Maurice.  PAD managed by Dr. Charlanne.    Patient saw Dr. Maurice in 03/2024.  He was doing okay from a heart failure standpoint.  He had been hospitalized in 01/2024 after presenting for evaluation of blood in his stool for 3 days in the setting of Xarelto  use.  Sounds like he was in shock.  He had transfusion of blood products just prior to experiencing hypotension.  He was treated with vasopressors for 2 days.  Ultimately everything resolved and was able to discharge.  He then had some issues with anemia that required hospitalization in 02/2024.  In clinic his weight was stable around 245 pounds (dry weight 250 pounds). He was continued on torsemide  60 mg twice daily and spironolactone  50 mg daily for diuretics. GLP-1 agent was prescribed at that time (felt like breathing would maybe improve with weight loss).    Patient reports overall doing well.  He denies issues with recurrent GI bleed (states he was treated previously for bleeding ulcer and has not had any other issues since).  He denies exertional chest pain.  Breathing is adequately managed with 3 L of O2 which she has been on for some time without recent adjustment.  He is on Ozempic  which was started in 04/2024.  He is down about 15 pounds.  Blood pressure more recently is marginal likely from the weight loss.  He occasionally has dizziness with standing.  He denies syncope.  He has chronic lower extremity swelling without progression that is well-managed with compression stockings and diuretics.    Cardiac testing:    RHC 04/2023.  Elevated right and left-sided pressures.  Significantly elevated pulmonary artery pressures.  Low cardiac index by thermodilution and Fick.  RECOMMENDATION:  Heart failure recommendations to follow.    Echo 04/2023.  Low normal left ventricular systolic function with an EF of 50%.  The LV cavity is normal in size.  There are no focal areas of hypokinesis.    Unable to assess diastolic function on the current study.  The RV appears mildly dilated mildly impaired function.  Mitral annular calcification with nonspecific mitral valve thickening.  There is no stenosis.  There is mild to moderate MR.  Moderate TR.  The aortic valve is sclerotic and calcified.  There is no significant stenosis.  Trace AI.  Elevated peak PAP of 55 mmHg.  The right atrial pressure is normal at 3 mmHg.  Normal aortic root size.  No pericardial effusion.    Prior study was obtained on 02/16/2023, showed an EF of 60%, the RV was dilated with impaired function, there was mitral annular calcification with mild MR, mild TR, the aortic valve was calcified with mild AI, the aorta was normal in size, no pericardial effusion, the range pressure was elevated at 15 mmHg, the peak pulmonary pressure was elevated at 82 mmHg.  The LV systolic function appears slightly less dynamic on the current study.  The peak  pulmonary pressure was calculated to be less elevated on the current study, the right atrial pressure is normal on the current study.    Reg thall MPI 04/2023.  Left Ventricular Ejection Fraction (post stress, in the resting state) =  48 %.   Left Ventricular End Diastolic Volume: 07.99 mL   SUMMARY/OPINION:    This study is low risk for significant inducible myocardial ischemia, there is mostly fixed inferior defect inferior defect with mild hypokinesis suggestive of prior myocardial injury.   Left ventricular systolic function is mildly reduced.   There are no high risk prognostic indicators present.    The pharmacologic ECG portion of the study is negative for ischemia.  There are no prior studies available for comparison.     LHC 10/2017.  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  Mild disease of the LAD and left main.  Successful intervention of the right coronary, re-establishing normal flow.  There continues to be serial 90% stenoses in the distal right coronary artery, that are also heavily calcified.    Lab Results   Component Value Date    LDL 23 11/09/2023    HDL 35 (L) 11/09/2023    TRIG 102 11/09/2023    CHOL 71 11/09/2023    HGB 10.9 (L) 01/17/2024    PLTCT 207 01/17/2024    HGBA1C 7.6 (H) 04/11/2023    CR 0.95 02/26/2024    K 4.3 02/26/2024    MG 2.3 04/15/2023    _______________________________________       Assessment and Plan     Chronic HFpEF. EF 50% per echo in 04/2023.  Past dry weight was around 250 pounds however he is actively losing weight on Ozempic .  In clinic he is 239 lbs. He reports NYHA class II-III symptoms.  Appears compensated on exam.  Continue metolazone  5 mg every 7 days, torsemide  60 mg twice daily, spironolactone  50 mg daily, and Toprol -XL 25 mg twice daily.  Coronary artery disease s/p PCI of RCA.  LHC in 10/2017 showed severe disease of the RCA and circumflex with complete occlusion of both vessels.  Mild disease of the LAD and left main.  Successful PCI of the RCA.  There continues to be serial 90% stenosis in the distal RCA that is also heavily calcified. Patient had a negative stress test in 04/2023.  He denies symptoms concerning for progressive angina.    He is on asa & xarelto  (low threshold to stop aspirin  if there is concern of recurrent bleeding as he had a bleeding ulcer mid 2025).  Ongoing risk factor modification outlined below.  Peripheral arterial disease. Conservatively managed.  Follows with Dr. Charlanne.    Ongoing risk factor modification outlined below.  Permanent atrial fibrillation. CHADS2 Vasc score at least 6.  Denies palpitations.  Continue Xarelto  20 mg daily.  Continue Toprol -XL 25 mg twice daily.  Should patient have recurrence of GI bleed in the future consider watchman implant.  Hypertension. Blood pressure is low to marginal. He has some mild dizziness with position changes.  Suspect reduction in his blood pressure is likely 2/2 weight loss in the setting of Ozempic .  He did have a GI bleed a few months ago but is not currently having evidence otherwise of abnormal bleeding.  Reduce IMDUR  to 15 mg daily.  Continue metolazone  5 mg every 7 days, Toprol -XL 25 mg twice  daily, spironolactone  50 mg daily and torsemide  60 mg twice daily.  Repeat CBC.   Hyperlipidemia. LDL 23 with goal <70.   Continue atorvastatin  80 mg daily.  OSA.    Patient is compliant with BiPAP.   Insulin -dependent type 2 diabetes.   As per endocrinology.  Bladder cancer s/p surgery and chemo.  Last echo shows EF 50%.  Follows with urology.  COPD/ remote tobacco use. 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  Follows with pulmonology.   Elevated BMI.  Patient started on GLP-1 at last visit in hopes of weight reduction improving breathing.  Ongoing titration of GLP-1 per pharmacist (next appointment on 07/15/2024).  H/o anemia.  Patient had blood in his stool in 01/2024 requiring blood products at outside hospital and vasopressors.  He then was rehospitalized in 02/2024 for treatment of anemia.  Hemoglobin 10.9 in 01/2024.  He denies evidence of abnormal bleeding at this time.  As above repeating CBC to confirm stability of hemoglobin    Thank you for allowing me to see Ian Velez in clinic today.  Appointment with Elenor Shove pharmacist on 07/15/2024.  Appointment Dr. Maurice at [facility] clinic on 10/03/2024.    Leonor Jay, APRN-C       Total time spent reviewing medical records, educating patient, and coordinating care: 40 minutes.     ______________________________________________    Problems Addressed Today  Encounter Diagnoses   Name Primary?    Diastolic dysfunction with chronic heart failure (CMS-HCC) Yes    Coronary artery disease of native artery of native heart with stable angina pectoris     Chronic atrial fibrillation (CMS-HCC)     Chronic anticoagulation     Anemia, unspecified type     Mixed hyperlipidemia     Essential hypertension     Obesity, Class III, BMI 40-49.9 (morbid obesity) (CMS-HCC)     Obstructive sleep apnea     On home oxygen therapy     Hypoxemia             Vitals:    06/30/24 1448 06/30/24 1449   BP: 104/62    BP Source: Arm, Left Upper    Pulse: 95    SpO2: 94%    O2 Device:  Nasal cannula   O2 Liter Flow:  3 Lpm   PainSc: Zero    Weight: 108.4 kg (239 lb)    Height: 167.6 cm (5' 6)      Body mass index is 38.58 kg/m?SABRA     Past Medical History  Patient Active Problem List    Diagnosis Date Noted    Iron  deficiency anemia 01/18/2024    Anemia 01/17/2024    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 (CMS-HCC) 07/18/2023    Cancer of overlapping sites of bladder (CMS-HCC) 07/17/2023    Neoplasm of bladder 07/16/2023    Acute on chronic diastolic heart failure with preserved ejection fraction (CMS-HCC) 04/10/2023    Chest pain 03/19/2022    Atherosclerosis of native arteries of left leg with ulceration of other part of foot (CMS-HCC) 03/02/2022     Per office visit note on 12/01/2021 with Loria Lewis, APRN.     11/15/2021 MRI Left foot ([facility]): Ulcer along the medial most aspect of the great toe distally is noted. There is extensive soft tissue swelling of the great toe extending towards the midfoot and hindfoot with post gadolinium enhancement consistent with cellulitis.    11/14/2021 US  Duplex left lower extremity  veins ([facility]): No evidence of dep venous thrombosis in the lower extremity veins as visualized.  10/31/2021 Bilateral lower ext arterial doppler US  ([facility]): 75% or greater stenosis involving the distal right SFA and/or proximal popliteal artery. Proximal trifurcation vessels on the right are not evaluated.       Diastolic dysfunction with chronic heart failure (CMS-HCC) 01/11/2018    Obesity, Class III, BMI 40-49.9 (morbid obesity) (CMS-HCC) 01/11/2018    Pulmonary hypertension (CMS-HCC)     Obstructive sleep apnea     OA (osteoarthritis)     Lumbosacral spondylolysis     Hypoxemia     Essential hypertension      Echo - 01/30/18 at [facility] - 1. Qualitatively normal LV size and function, EF ~ 55%; 2. Unable to accurately assess diastolic function due to underlying atrial fibrillation; 3. Qualitatively the RV appears mildly dilated with mildly reduced systolic function 4. Qualitatively the RA appears mildly dilated; 5. Valvular structures were not well visualized.  No significant Doppler abnormalities; 6. Estimated peak systolic PA pressure =  45 mmHg; 7. No pericardial effusion      Mixed hyperlipidemia     History of bladder carcinoma     DM (diabetes mellitus), type 2 (CMS-HCC)     Stage 3 severe COPD by GOLD classification (CMS-HCC)     Chronic atrial fibrillation (CMS-HCC)     Coronary artery disease of native artery of native heart with stable angina pectoris      11/07/2017 coronary angiogram  - Highly complex severe disease of the right coronary and circumflex with a complete occlusion of both the circumflex and right coronary artery with TIMI 1 flow distal in both arteries.Mild disease of the LAD and left main.Successful intervention of the right coronary, re-establishing normal flow.  There continues to be serial 90% stenoses in the distal right coronary artery, that are also heavily calcified.      Chronic GERD     On home oxygen therapy      3L      Chronic stable angina     Chronic anticoagulation          Review of Systems   Neurological:  Positive for dizziness.   All other systems reviewed and are negative. Physical Exam  General Appearance: no acute distress  Skin: warm & intact  HEENT: unremarkable  Neck Veins: neck veins are flat & not distended  Carotid Arteries: no bruits  Auscultation/Percussion: lungs clear to auscultation  Cardiac Auscultation: RRR, Normal S1 & S2, no cardiac murmurs   Extremities: minimal dependent lower extremity edema; symmetric distal pulses  Abdominal Exam: soft, non-tender  Neurologic Exam: oriented to time, place and person  Psychiatric: Normal mood and affect.     Cardiovascular Studies      Cardiovascular Health Factors  Vitals BP Readings from Last 3 Encounters:   06/30/24 104/62   03/21/24 114/62   01/17/24 114/53     Wt Readings from Last 3 Encounters:   06/30/24 108.4 kg (239 lb)   03/21/24 110.8 kg (244 lb 3.2 oz)   01/17/24 114.2 kg (251 lb 12.8 oz)     BMI Readings from Last 3 Encounters:   06/30/24 38.58 kg/m?   03/21/24 40.64 kg/m?   01/17/24 41.90 kg/m?      Smoking Tobacco Use History[1]   Lipid Profile Cholesterol   Date Value Ref Range Status   11/09/2023 71  Final     HDL   Date Value Ref Range Status  11/09/2023 35 (L) >=40 Final     LDL   Date Value Ref Range Status   11/09/2023 23  Final     Triglycerides   Date Value Ref Range Status   11/09/2023 102  Final      Blood Sugar Hemoglobin A1C   Date Value Ref Range Status   04/11/2023 7.6 (H) 4.0 - 5.7 % Final     Comment:     The ADA recommends that most patients with type 1 and type 2 diabetes maintain   an A1c level <7%.       Glucose   Date Value Ref Range Status   02/26/2024 180 (H) 74 - 106 Final   12/25/2023 161 (H) 74 - 106 Final   12/18/2023 193 (H) 74 - 106 Final     Glucose, POC   Date Value Ref Range Status   01/17/2024 272 (H) 70 - 100 mg/dL Final   98/82/7974 724 (H) 70 - 100 mg/dL Final   98/83/7974 725 (H) 70 - 100 mg/dL Final               Current Medications (including today's revisions)   acetaminophen  SR (TYLENOL  8 HOUR) 650 mg tablet Take one tablet by mouth as Needed for Pain.    albuterol  0.5% (PROVENTIL ; VENTOLIN ) 2.5 mg/0.5 mL nebulizer solution Inhale one each solution by nebulizer as directed as Needed for Shortness of Breath or Wheezing.    albuterol  sulfate (PROAIR  HFA) 90 mcg/actuation HFA aerosol inhaler Inhale two puffs by mouth into the lungs as Needed for Wheezing or Shortness of Breath.    ALPRAZolam  (XANAX ) 0.25 mg tablet Take one tablet by mouth at bedtime as needed.    ascorbic acid (vitamin C) (VITAMIN C) 1,000 mg tablet Take one tablet by mouth daily.    aspirin  EC (ASPIR-LOW) 81 mg tablet Take one tablet by mouth daily.    atorvastatin  (LIPITOR) 80 mg tablet TAKE ONE TABLET BY MOUTH DAILY    bacitracin  zinc  500 unit/g topical ointment Apply  topically to affected area as Needed (as needed for comfort). Apply a small amount to head of penis/catheter 2 times daily to reduce irritation.  Indications: catheter lubrication    blood-glucose sensor (FREESTYLE LIBRE 3 SENSOR) sensor device Use one each as directed every 14 days.    calcium  carbonate-vitamin D3 (OS-CAL 500 + D) 1250 mg/200 unit tablet Take one tablet by mouth at bedtime daily. Calcium  Carb 1250mg  delivers 500mg  elemental Ca    cyanocobalamin  (vitamin B-12) 1,000 mcg tablet Take one tablet by mouth daily.    cyclobenzaprine  (FLEXERIL ) 5 mg tablet Take one tablet by mouth as Needed for Muscle Cramps.    diclofenac  sodium (VOLTAREN ) 1 % topical gel Apply four g topically to affected area four times daily as needed. Indications: pain    elderberry fruit 350 mg cap Take one capsule by mouth daily.    HYDROcodone /acetaminophen  (NORCO) 5/325 mg tablet Take one tablet by mouth as Needed for Pain.    hydrocortisone (HYTONE) 2.5 % topical cream Apply  topically to affected area as Needed.    insulin  aspart 70/30 (+) (NOVOLOG  MIX 70/30) 100 unit/mL (70-30) Inject eighty Units under the skin twice daily. Sometimes will drop evening dose to 60-70u if BG is low    isosorbide  mononitrate ER (IMDUR ) 30 mg tablet,extended release 24 hr Take one-half tablet by mouth every morning.    magnesium  oxide (PHILLIPS) 500 mg magnesium  tablet Take one tablet by mouth at bedtime daily.  metOLazone  (ZAROXOLYN ) 5 mg tablet Take one tablet by mouth every 7 days.    metoprolol  succinate XL (TOPROL  XL) 25 mg extended release tablet Take one tablet by mouth twice daily. Indications: .    montelukast  (SINGULAIR ) 10 mg tablet Take one tablet by mouth at bedtime daily.    multivitamin (ONE-A-DAY) tablet Take one tablet by mouth daily.    nitroglycerin  (NITROSTAT ) 0.4 mg tablet Place one tablet under tongue every 5 minutes as needed for Chest Pain. Max of 3 tablets, call 911.    pantoprazole  DR (PROTONIX ) 40 mg tablet Take one tablet by mouth twice daily.    pen needle, diabetic (BD NANO 2ND GEN PEN NEEDLE) 32 gauge x 5/32 pen needle Use one each as directed as Needed.    polyethylene glycol 3350  (MIRALAX ) 17 gram/dose powder Take seventeen g by mouth daily. Indications: constipation    potassium chloride  SR (K-DUR) 20 mEq tablet Take two tablets by mouth twice daily. Take with a meal and a full glass of water .  Indications: prevention of low potassium in the blood    rivaroxaban  (XARELTO ) 20 mg tablet Take one tablet by mouth daily. Take with food.    semaglutide  (OZEMPIC ) 1 mg/dose (4 mg/3 mL) injection PEN Inject one mg under the skin every 7 days.    sennosides-docusate sodium  (SENOKOT-S) 8.6/50 mg tablet Take one tablet by mouth as Needed.    spironolactone  (ALDACTONE ) 50 mg tablet Take one tablet by mouth daily. take with food    SYMBICORT  80-4.5 mcg/actuation aerosol inhaler Inhale two puffs by mouth into the lungs twice daily.    tiotropium bromide  (SPIRIVA ) 18 mcg capsule for inhaler Place one capsule into inhaler and inhale into lungs as directed daily.    tiZANidine  (ZANAFLEX ) 2 mg tablet Take one tablet by mouth every 8 hours as needed. Indications: muscle spasm (Patient taking differently: Take one tablet by mouth as Needed. Indications: muscle spasm) torsemide  (DEMADEX ) 20 mg tablet Take 60mg  twice daily. Take by mouth.  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                   [1]   Social History  Tobacco Use   Smoking Status Former    Current packs/day: 0.00    Average packs/day: 2.0 packs/day for 50.0 years (100.0 ttl pk-yrs)    Types: Cigarettes    Start date: 07/12/1958    Quit date: 07/12/2008    Years since quitting: 15.9   Smokeless Tobacco Never

## 2024-06-30 ENCOUNTER — Encounter: Admit: 2024-06-30 | Discharge: 2024-06-30 | Payer: MEDICARE

## 2024-06-30 ENCOUNTER — Ambulatory Visit: Admit: 2024-06-30 | Discharge: 2024-07-01 | Payer: MEDICARE

## 2024-06-30 VITALS — BP 104/62 | HR 95 | Ht 66.0 in | Wt 239.0 lb

## 2024-06-30 DIAGNOSIS — E66813 Obesity, Class III, BMI 40-49.9 (morbid obesity) (CMS-HCC): Secondary | ICD-10-CM

## 2024-06-30 DIAGNOSIS — R0902 Hypoxemia: Secondary | ICD-10-CM

## 2024-06-30 DIAGNOSIS — I10 Essential (primary) hypertension: Secondary | ICD-10-CM

## 2024-06-30 DIAGNOSIS — D649 Anemia, unspecified: Secondary | ICD-10-CM

## 2024-06-30 DIAGNOSIS — Z7901 Long term (current) use of anticoagulants: Secondary | ICD-10-CM

## 2024-06-30 DIAGNOSIS — I482 Chronic atrial fibrillation, unspecified: Secondary | ICD-10-CM

## 2024-06-30 DIAGNOSIS — G4733 Obstructive sleep apnea (adult) (pediatric): Secondary | ICD-10-CM

## 2024-06-30 DIAGNOSIS — I5032 Chronic diastolic (congestive) heart failure: Secondary | ICD-10-CM

## 2024-06-30 DIAGNOSIS — E782 Mixed hyperlipidemia: Secondary | ICD-10-CM

## 2024-06-30 DIAGNOSIS — I25118 Atherosclerotic heart disease of native coronary artery with other forms of angina pectoris: Principal | ICD-10-CM

## 2024-06-30 DIAGNOSIS — Z9981 Dependence on supplemental oxygen: Secondary | ICD-10-CM

## 2024-06-30 MED ORDER — ISOSORBIDE MONONITRATE 30 MG PO TB24
15 mg | ORAL_TABLET | Freq: Every morning | ORAL | 1 refills | 90.00000 days | Status: AC
Start: 2024-06-30 — End: ?

## 2024-07-02 LAB — CBC
HEMATOCRIT: 44
HEMOGLOBIN: 14
MCH: 28
MCHC: 32
MCV: 90
PLATELET COUNT: 246
RBC COUNT: 4.8
RDW: 16 — ABNORMAL HIGH
WBC COUNT: 9.7

## 2024-07-04 ENCOUNTER — Encounter: Admit: 2024-07-04 | Discharge: 2024-07-04 | Payer: MEDICARE

## 2024-07-04 DIAGNOSIS — D649 Anemia, unspecified: Secondary | ICD-10-CM

## 2024-07-04 DIAGNOSIS — I482 Chronic atrial fibrillation, unspecified: Secondary | ICD-10-CM

## 2024-07-04 DIAGNOSIS — I25118 Atherosclerotic heart disease of native coronary artery with other forms of angina pectoris: Principal | ICD-10-CM

## 2024-07-10 ENCOUNTER — Encounter: Admit: 2024-07-10 | Discharge: 2024-07-10 | Payer: MEDICARE

## 2024-07-10 NOTE — Progress Notes [1]
 Cardiology Clinic Pharmacist - Follow-up Patient Visit     A follow-up comprehensive medication management visit was completed today via telephone.    Referral reason: GLP1 Initiation and Titration (Cardiology)  Referring provider: Maurice Elsie DASEN, MD    Assessment & Plan     Obesity    Plan  -INCREASE semaglutide  (Ozempic ) 1 mg -->> 2 mg SQ every 7 days for T2DM/secondary prevention/weight loss       -Authorization approved, copay = $626. Patient was educated on the [facility] Prescription Payment Plan. [facility].       -Patient to follow-up in 3 weeks.    Follow-up  The patient will continue to follow up with the pharmacist. Return to pharmacist in 3 weeks via telephone. The return visit was scheduled during today's visit.    Subjective & Objective    07/15/24 - Ian Velez reports that he is generally feeling well. He increased the semaglutide  (Ozempic ) to the 1 mg dose as instructed and denies side effects. He gave his 4th injection of the 1 mg dose yesterday (01/12). He wears a CGM and reports the following: 7 day average 200 and 14 day average 189. He denies any episodes of hypoglycemia. His weight has decreased to about 240 on his home scale, reports that he has lost approximately 10 lbs since starting the semaglutide  (Ozempic ).     05/27/24 - Ian Velez reports that he is generally feeling well. He increased the semaglutide  (Ozempic ) to the 0.5 mg dose as instructed. He has given two doses of the 0.5 mg and denies side effects. He wears a CGM and reports that his 7 day average BG has been 173 and 14 day average is 182. He denies any episodes of hypoglycemia.    05/05/24 Ian Velez reports that he is generally feeling well. He experienced some nausea and vomiting with his first dose of the semaglutide  (Ozempic ) 0.25 mg, but has tolerated the last 3 doses without issue. He has been using a [employer] and his BG readings have been in the 130-300 range depending on whether the readings were taken fasting or post-prandial. He denies any episodes of hypoglycemia. His weight is stable, but he does endorse a reduced appetite after starting GLP-1 RA therapy.    04/03/24 - Patient reports that he was recently switched from Novolog  mixed insulin  to a generic and his BG readings have been very elevated. He measures his BG several times throughout the day. Both his AM fasting and 2 hour post-prandial readings are generally in the 200-300 range.    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)  Baseline weight: 110.8 kg  Baseline BMI: 40.6 kg/m2    Safety Assessment  History of pancreatitis: no  Patient or family history of medullary thyroid carcinoma or multiple neoplasia syndrome type 2: no    Labs and Diagnostic Tests  Lab Results   Component Value Date/Time    HGBA1C 7.6 (H) 04/11/2023 03:44 AM    HGBA1C 8.4 (H) 06/22/2020 12:00 AM    HGBA1C 7.5 (H) 12/22/2019 12:00 AM       Wt Readings from Last 3 Encounters:   06/30/24 108.4 kg (239 lb)   03/21/24 110.8 kg (244 lb 3.2 oz)   01/17/24 114.2 kg (251 lb 12.8 oz)       Lab Results   Component Value Date/Time    NA 136 02/26/2024 12:00 AM    K  4.3 02/26/2024 12:00 AM    CL 100 02/26/2024 12:00 AM    CO2 30 02/26/2024 12:00 AM    GAP 6 02/26/2024 12:00 AM    BUN 10 02/26/2024 12:00 AM    CR 0.95 02/26/2024 12:00 AM    GLU 180 (H) 02/26/2024 12:00 AM       Lab Results   Component Value Date/Time    CA 8.4 02/26/2024 12:00 AM    PO4 3.8 04/10/2023 08:05 PM    ALBUMIN 3.3 (L) 02/26/2024 12:00 AM    TOTPROT 6.3 02/26/2024 12:00 AM    ALKPHOS 73 02/26/2024 12:00 AM    AST 37 (H) 02/26/2024 12:00 AM    ALT 28 02/26/2024 12:00 AM    TOTBILI 1.1 02/26/2024 12:00 AM    GFR 69 11/09/2023 12:00 AM    GFRAA >60 12/22/2019 12:00 AM       11/07/17 [facility]  [facility]     CARDIAC CATHETERIZATION REPORT  Page 1  Ian Velez M            DOB:  Sep 04, 1940  [place]#: [id number]        [place] MR #/Billing ID #:  [id number] / [id number]  DATE:  11/07/2017  CARDIOLOGIST:                           ERIC GORMAN DARTING, MD  DICTATING PROVIDER:             ERIC GORMAN DARTING, MD  REFERRING PHYSICIAN:         ELLIS BERKOWITZ     INDICATIONS FOR PROCEDURE:  Severe angina, multivessel disease, turned down for surgery.     PROCEDURES PERFORMED:    1. Coronary angiography.  2. Left heart catheterization.  3. 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     PROCEDURAL DETAILS:  After obtaining informed consent, the patient was brought to the cardiac cath lab in a fasting, nonsedated state.  The right wrist was prepped and draped in the usual fashion.  We then used a Tiger 4 catheter and advanced it into the left ventricle for hemodynamic monitoring.  It was withdrawn into the aorta for repeat hemodynamics.  We then directed it into the left coronary for multiple views of the left coronary.  We then used it for the right coronary angiography.  After identifying a heavily calcified, tortuous complete occlusion of the right coronary, we elected to obtain access from the right femoral artery.  Because of the heavy calcification, I also recommended obtaining venous access for the possibility of using Rotablator and a pacemaker.  Using ultrasound, I was able to identify a suitable artery and vein.  Under direct visualization I was able to cannulate the artery and vein.  The micropuncture wire was placed and then upsized for standard wires.  A 5-French sheath was placed in the vein and a 7-French was placed in the artery.  We then advanced an AL0.75 guide to the right coronary artery.  Additional heparin was given.  A BMW wire was advanced down the artery and a 2.0 x 15 mm balloon was advanced and deployed for multiple inflations to 12 atmospheres.  We then placed a 2.5 x 15 over the long wire at 14 atmospheres twice.  We then placed a 3.0 x 15 NC balloon with multiple inflations 16 to 24 atmospheres.  We then attempted to cross the  lesion with an OCT catheter, but it would not cross the lesion despite multiple inflations.  We then attempted to place a 3.25 x 23 mm Xience drug-eluting stent with the support of a 2nd wire and GuideLiner and it would not advance.  We then inflated with a 3.5 x 12 mm Trek NC and then we were able to advance a 3.5 x 24 mm Synergy stent.  It was post dilated up to 22 atmospheres with the 3.5 x 12 mm NC balloon.  Images revealed a possible dissection distal to the stent.  I then tried to deliver 2 different stents, 1 was a 3.0 x 18 and another 3.0 x 8, and neither one would deliver across the distal end of the 1st stent that was placed due to a significant tortuosity.  We therefore elected to stop the procedure at that point, given the amount of contrast and radiation exposure to the patient.  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      TIMI flow preprocedure was TIMI flow 1.  TIMI flow postprocedure was TIMI-3.  ACC type was type C, high risk.     FINDINGS:       HEMODYNAMICS:    1. Aortic pressure 107/69, mean 85 mmHg.  2. Left ventricular pressure 137/EDP 22 mmHg.     ANATOMY:    1. Left main:  Arose from the left coronary cusp.  It bifurcates into the LAD and circumflex.  The left main has distal 30% narrowing.  2. Left anterior descending:  Has diffuse calcification and only mild irregularities in the range of 20% to 30% in the proximal and midportion.  It supplies 2 diagonals, which are free of any significant disease.  3. Circumflex:  Completely occluded in the midportion.  Proximal to that it supplies an obtuse marginal and then it bifurcates distal to the occlusion into a terminal bifurcating obtuse marginal.  4. Right coronary artery:  100% occluded proximally.  There was a trickle of flow with TIMI 1 flow through the lesion prior to intervention.  After this, there is a relatively high bifurcation and then there is a 90% followed by another 90% stenosis in the posterior descending artery.      After intervention, there was minimal residual stenosis in the stented portion, but there does appear to be a focal stable dissection distal to the stent.     CONTRAST:  Visipaque-320, a total of 240 mL.     RADIATION EXPOSURE:  Fluoroscopy time was 26.6 minutes with an air kerma total of 4867 mGy.     MEDICATIONS:  Lidocaine  30 mL total, fentanyl  200 mcg, heparin 10,000 units, nitroglycerin  200 mcg, verapamil 2.5 mg and Versed 2.5 mg in divided doses.     CONCLUSIONS:    1. 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  2. Mild disease of the LAD and left main.  3. Successful intervention of the right coronary, re-establishing normal flow.  There continues to be serial 90% stenoses in the distal right coronary artery, that are also heavily calcified.    Home Medications   Medication Sig   acetaminophen  SR (TYLENOL  8 HOUR) 650 mg tablet Take one tablet by mouth as Needed for Pain.   albuterol  0.5% (PROVENTIL ; VENTOLIN ) 2.5 mg/0.5 mL nebulizer solution Inhale one each solution by nebulizer as directed as  Needed for Shortness of Breath or Wheezing.   albuterol  sulfate (PROAIR  HFA) 90 mcg/actuation HFA aerosol inhaler Inhale two puffs by mouth into the lungs as Needed for Wheezing or Shortness of Breath.   ALPRAZolam  (XANAX ) 0.25 mg tablet Take one tablet by mouth at bedtime as needed.   ascorbic acid (vitamin C) (VITAMIN C) 1,000 mg tablet Take one tablet by mouth daily.   aspirin  EC (ASPIR-LOW) 81 mg tablet Take one tablet by mouth daily.   atorvastatin  (LIPITOR) 80 mg tablet TAKE ONE TABLET BY MOUTH DAILY   bacitracin  zinc  500 unit/g topical ointment Apply  topically to affected area as Needed (as needed for comfort). Apply a small amount to head of penis/catheter 2 times daily to reduce irritation.  Indications: catheter lubrication   blood-glucose sensor (FREESTYLE LIBRE 3 SENSOR) sensor device Use one each as directed every 14 days.   calcium  carbonate-vitamin D3 (OS-CAL 500 + D) 1250 mg/200 unit tablet Take one tablet by mouth at bedtime daily. Calcium  Carb 1250mg  delivers 500mg  elemental Ca   cyanocobalamin  (vitamin B-12) 1,000 mcg tablet Take one tablet by mouth daily.   cyclobenzaprine  (FLEXERIL ) 5 mg tablet Take one tablet by mouth as Needed for Muscle Cramps.   diclofenac  sodium (VOLTAREN ) 1 % topical gel Apply four g topically to affected area four times daily as needed. Indications: pain   elderberry fruit 350 mg cap Take one capsule by mouth daily.   HYDROcodone /acetaminophen  (NORCO) 5/325 mg tablet Take one tablet by mouth as Needed for Pain.   hydrocortisone (HYTONE) 2.5 % topical cream Apply  topically to affected area as Needed.   insulin  aspart 70/30 (+) (NOVOLOG  MIX 70/30) 100 unit/mL (70-30) Inject eighty Units under the skin twice daily. Sometimes will drop evening dose to 60-70u if BG is low   isosorbide  mononitrate ER (IMDUR ) 30 mg tablet,extended release 24 hr Take one-half tablet by mouth every morning.   magnesium  oxide (PHILLIPS) 500 mg magnesium  tablet Take one tablet by mouth at bedtime daily.   metOLazone  (ZAROXOLYN ) 5 mg tablet Take one tablet by mouth every 7 days.   metoprolol  succinate XL (TOPROL  XL) 25 mg extended release tablet Take one tablet by mouth twice daily. Indications: .   montelukast  (SINGULAIR ) 10 mg tablet Take one tablet by mouth at bedtime daily.   multivitamin (ONE-A-DAY) tablet Take one tablet by mouth daily.   nitroglycerin  (NITROSTAT ) 0.4 mg tablet Place one tablet under tongue every 5 minutes as needed for Chest Pain. Max of 3 tablets, call 911.   pantoprazole  DR (PROTONIX ) 40 mg tablet Take one tablet by mouth twice daily.   pen needle, diabetic (BD NANO 2ND GEN PEN NEEDLE) 32 gauge x 5/32 pen needle Use one each as directed as Needed.   polyethylene glycol 3350  (MIRALAX ) 17 gram/dose powder Take seventeen g by mouth daily. Indications: constipation   potassium chloride  SR (K-DUR) 20 mEq tablet Take two tablets by mouth twice daily. Take with a meal and a full glass of water .  Indications: prevention of low potassium in the blood   rivaroxaban  (XARELTO ) 20 mg tablet Take one tablet by mouth daily. Take with food.   semaglutide  (OZEMPIC ) 1 mg/dose (4 mg/3 mL) injection PEN Inject one mg under the skin every 7 days.   sennosides-docusate sodium  (SENOKOT-S) 8.6/50 mg tablet Take one tablet by mouth as Needed.   spironolactone  (ALDACTONE ) 50 mg tablet Take one tablet by mouth daily. take with food   SYMBICORT  80-4.5 mcg/actuation aerosol inhaler Inhale two puffs  by mouth into the lungs twice daily.   tiotropium bromide  (SPIRIVA ) 18 mcg capsule for inhaler Place one capsule into inhaler and inhale into lungs as directed daily.   tiZANidine  (ZANAFLEX ) 2 mg tablet Take one tablet by mouth every 8 hours as needed. Indications: muscle spasm  Patient taking differently: Take one tablet by mouth as Needed. Indications: muscle spasm   torsemide  (DEMADEX ) 20 mg tablet Take 60mg  twice daily. Take by mouth.  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

## 2024-07-11 ENCOUNTER — Encounter: Admit: 2024-07-11 | Discharge: 2024-07-11 | Payer: MEDICARE

## 2024-07-15 ENCOUNTER — Ambulatory Visit: Admit: 2024-07-15 | Discharge: 2024-07-16 | Payer: MEDICARE

## 2024-07-15 DIAGNOSIS — E1159 Type 2 diabetes mellitus with other circulatory complications: Principal | ICD-10-CM

## 2024-07-15 MED ORDER — OZEMPIC 2 MG/DOSE (8 MG/3 ML) SC PNIJ
2 mg | SUBCUTANEOUS | 0 refills | 28.00000 days | Status: AC
Start: 2024-07-15 — End: ?

## 2024-07-15 NOTE — Patient Instructions [37]
 I have sent a prescription for semaglutide  (Ozempic ) 2 mg to [facility].  Here is a link to the Medicare.gov website where you could find more information about the Medicare Prescription Payment Plan:    [email or web address]    If you have any questions or concerns please call my direct line at [phone]. If I do not answer please leave a message and I will call you back as soon as possible.     Nakkia Mackiewicz, Pharm.D., BCACP  Ambulatory Clinical Pharmacist - Cardiology  [facility]

## 2024-07-16 DIAGNOSIS — Z794 Long term (current) use of insulin: Secondary | ICD-10-CM

## 2024-07-31 ENCOUNTER — Encounter: Admit: 2024-07-31 | Discharge: 2024-07-31 | Payer: MEDICARE

## 2024-08-04 ENCOUNTER — Ambulatory Visit: Admit: 2024-08-04 | Discharge: 2024-08-05 | Payer: MEDICARE

## 2024-08-04 DIAGNOSIS — E1159 Type 2 diabetes mellitus with other circulatory complications: Principal | ICD-10-CM

## 2024-08-04 MED ORDER — OZEMPIC 2 MG/DOSE (8 MG/3 ML) SC PNIJ
2 mg | SUBCUTANEOUS | 11 refills | 28.00000 days | Status: AC
Start: 2024-08-04 — End: ?

## 2024-08-05 DIAGNOSIS — Z794 Long term (current) use of insulin: Secondary | ICD-10-CM
